# Patient Record
Sex: Female | Born: 1980 | Hispanic: Yes | Smoking: Never smoker
Health system: Southern US, Community
[De-identification: ages and names within clinical notes are randomized; demographics above are authoritative.]

## PROBLEM LIST (undated history)

## (undated) DIAGNOSIS — R7303 Prediabetes: Secondary | ICD-10-CM

## (undated) HISTORY — PX: ABDOMINAL HYSTERECTOMY: SHX81

## (undated) HISTORY — PX: CHOLECYSTECTOMY: SHX55

---

## 2009-12-18 ENCOUNTER — Other Ambulatory Visit: Admission: RE | Admit: 2009-12-18 | Discharge: 2009-12-18 | Payer: Self-pay | Admitting: Obstetrics and Gynecology

## 2011-06-25 ENCOUNTER — Other Ambulatory Visit (HOSPITAL_COMMUNITY)
Admission: RE | Admit: 2011-06-25 | Discharge: 2011-06-25 | Disposition: A | Discharge status: Home / Self Care | Payer: Self-pay | Source: Ambulatory Visit | Attending: Obstetrics & Gynecology | Admitting: Obstetrics & Gynecology

## 2011-06-25 DIAGNOSIS — Z01419 Encounter for gynecological examination (general) (routine) without abnormal findings: Secondary | ICD-10-CM | POA: Insufficient documentation

## 2012-07-20 ENCOUNTER — Other Ambulatory Visit: Payer: Self-pay | Admitting: Obstetrics & Gynecology

## 2012-07-24 ENCOUNTER — Encounter: Payer: Self-pay | Admitting: *Deleted

## 2012-07-25 ENCOUNTER — Ambulatory Visit (INDEPENDENT_AMBULATORY_CARE_PROVIDER_SITE_OTHER): Payer: BC Managed Care – PPO | Admitting: Obstetrics & Gynecology

## 2012-07-25 ENCOUNTER — Encounter: Payer: Self-pay | Admitting: Obstetrics & Gynecology

## 2012-07-25 VITALS — BP 100/60 | Ht 63.0 in | Wt 157.0 lb

## 2012-07-25 DIAGNOSIS — Z01419 Encounter for gynecological examination (general) (routine) without abnormal findings: Secondary | ICD-10-CM

## 2012-07-25 DIAGNOSIS — Z7251 High risk heterosexual behavior: Secondary | ICD-10-CM

## 2012-07-25 NOTE — Progress Notes (Signed)
Patient ID: Taleeyah Bora, female   DOB: [birth year], 32 y.o.   MRN: [id number] Subjective:     Domonique Cothran is a 32 y.o. female here for a routine exam.  Patient's last menstrual period was 07/13/2012. G1P1 Current complaints: wants std screen.  Personal health questionnaire reviewed: no.   Gynecologic History Patient's last menstrual period was 07/13/2012. Contraception: none Last Pap: 2013. Results were: normal Last mammogram: na. Results were: normal  Obstetric History OB History   Grav Para Term Preterm Abortions TAB SAB Ect Mult Living   1 1        1      # Outc Date GA Lbr Len/2nd Wgt Sex Del Anes PTL Lv   1 PAR 2005    F SVD   Yes       The following portions of the patient's history were reviewed and updated as appropriate: allergies, current medications, past family history, past medical history, past social history, past surgical history and problem list.  Review of Systems  Review of Systems  Constitutional: Negative for fever, chills, weight loss, malaise/fatigue and diaphoresis.  HENT: Negative for hearing loss, ear pain, nosebleeds, congestion, sore throat, neck pain, tinnitus and ear discharge.   Eyes: Negative for blurred vision, double vision, photophobia, pain, discharge and redness.  Respiratory: Negative for cough, hemoptysis, sputum production, shortness of breath, wheezing and stridor.   Cardiovascular: Negative for chest pain, palpitations, orthopnea, claudication, leg swelling and PND.  Gastrointestinal: negative for abdominal pain. Negative for heartburn, nausea, vomiting, diarrhea, constipation, blood in stool and melena.  Genitourinary: Negative for dysuria, urgency, frequency, hematuria and flank pain.  Musculoskeletal: Negative for myalgias, back pain, joint pain and falls.  Skin: Negative for itching and rash.  Neurological: Negative for dizziness, tingling, tremors, sensory change, speech change, focal weakness, seizures, loss of consciousness, weakness  and headaches.  Endo/Heme/Allergies: Negative for environmental allergies and polydipsia. Does not bruise/bleed easily.  Psychiatric/Behavioral: Negative for depression, suicidal ideas, hallucinations, memory loss and substance abuse. The patient is not nervous/anxious and does not have insomnia.        Objective:    Physical Exam  Vitals reviewed. Constitutional: She is oriented to person, place, and time. She appears well-developed and well-nourished.  HENT:  Head: Normocephalic and atraumatic.        Right Ear: External ear normal.  Left Ear: External ear normal.  Nose: Nose normal.  Mouth/Throat: Oropharynx is clear and moist.  Eyes: Conjunctivae and EOM are normal. Pupils are equal, round, and reactive to light. Right eye exhibits no discharge. Left eye exhibits no discharge. No scleral icterus.  Neck: Normal range of motion. Neck supple. No tracheal deviation present. No thyromegaly present.  Cardiovascular: Normal rate, regular rhythm, normal heart sounds and intact distal pulses.  Exam reveals no gallop and no friction rub.   No murmur heard. Respiratory: Effort normal and breath sounds normal. No respiratory distress. She has no wheezes. She has no rales. She exhibits no tenderness.  GI: Soft. Bowel sounds are normal. She exhibits no distension and no mass. There is no tenderness. There is no rebound and no guarding.  Genitourinary:       Vulva is normal without lesions Vagina is pink moist without discharge Cervix normal in appearance and pap is not done Uterus is normal size shape and contour Adnexa is negative with normal sized ovaries   Musculoskeletal: Normal range of motion. She exhibits no edema and no tenderness.  Neurological: She is alert and oriented to person,  place, and time. She has normal reflexes. She displays normal reflexes. No cranial nerve deficit. She exhibits normal muscle tone. Coordination normal.  Skin: Skin is warm and dry. No rash noted. No erythema.  No pallor.  Psychiatric: She has a normal mood and affect. Her behavior is normal. Judgment and thought content normal.       Assessment:    Healthy female exam.    Plan:    Follow up in: 1 year.

## 2012-07-26 LAB — HSV 2 ANTIBODY, IGG: HSV 2 Glycoprotein G Ab, IgG: <0.1 IV

## 2012-07-26 LAB — RPR

## 2012-08-21 ENCOUNTER — Telehealth: Payer: Self-pay | Admitting: Obstetrics and Gynecology

## 2012-08-22 ENCOUNTER — Telehealth: Payer: Self-pay | Admitting: *Deleted

## 2012-08-22 NOTE — Telephone Encounter (Signed)
Pt informed all labs and Pap on 07/25/2012 WNL.

## 2012-08-24 NOTE — Telephone Encounter (Signed)
Pt aware of results 

## 2013-12-10 ENCOUNTER — Encounter: Payer: Self-pay | Admitting: Obstetrics & Gynecology

## 2015-02-11 ENCOUNTER — Encounter: Payer: Self-pay | Admitting: Obstetrics & Gynecology

## 2015-02-11 ENCOUNTER — Other Ambulatory Visit (HOSPITAL_COMMUNITY)
Admission: RE | Admit: 2015-02-11 | Discharge: 2015-02-11 | Disposition: A | Discharge status: Home / Self Care | Payer: BC Managed Care – PPO | Source: Ambulatory Visit | Attending: Obstetrics & Gynecology | Admitting: Obstetrics & Gynecology

## 2015-02-11 ENCOUNTER — Ambulatory Visit (INDEPENDENT_AMBULATORY_CARE_PROVIDER_SITE_OTHER): Payer: BC Managed Care – PPO | Admitting: Obstetrics & Gynecology

## 2015-02-11 VITALS — BP 110/80 | HR 80 | Ht 65.0 in | Wt 172.0 lb

## 2015-02-11 DIAGNOSIS — Z1151 Encounter for screening for human papillomavirus (HPV): Secondary | ICD-10-CM | POA: Diagnosis not present

## 2015-02-11 DIAGNOSIS — Z01411 Encounter for gynecological examination (general) (routine) with abnormal findings: Secondary | ICD-10-CM | POA: Diagnosis present

## 2015-02-11 DIAGNOSIS — Z01419 Encounter for gynecological examination (general) (routine) without abnormal findings: Secondary | ICD-10-CM

## 2015-02-11 NOTE — Progress Notes (Signed)
Patient ID: Gina Odonnell, female   DOB: 1980-05-21, 35 y.o.   MRN: [id number] Subjective:     Gina Odonnell is a 35 y.o. female here for a routine exam.  Patient's last menstrual period was 02/08/2015. G1P1 Birth Control Method:  none Menstrual Calendar(currently): regular  Current complaints: none.   Current acute medical issues:  none   Recent Gynecologic History Patient's last menstrual period was 02/08/2015. Last Pap: 2014,  normal Last mammogram: na,    History reviewed. No pertinent past medical history.  Past Surgical History  Procedure Laterality Date  . Cholecystectomy      OB History    Gravida Para Term Preterm AB TAB SAB Ectopic Multiple Living   1 1        1       Social History   Social History  . Marital Status: Unknown    Spouse Name: N/A  . Number of Children: N/A  . Years of Education: N/A   Social History Main Topics  . Smoking status: Never Smoker   . Smokeless tobacco: Never Used  . Alcohol Use: No  . Drug Use: No  . Sexual Activity: Yes   Other Topics Concern  . None   Social History Narrative    Family History  Problem Relation Age of Onset  . Cancer Other     No current outpatient prescriptions on file.  Review of Systems  Review of Systems  Constitutional: Negative for fever, chills, weight loss, malaise/fatigue and diaphoresis.  HENT: Negative for hearing loss, ear pain, nosebleeds, congestion, sore throat, neck pain, tinnitus and ear discharge.   Eyes: Negative for blurred vision, double vision, photophobia, pain, discharge and redness.  Respiratory: Negative for cough, hemoptysis, sputum production, shortness of breath, wheezing and stridor.   Cardiovascular: Negative for chest pain, palpitations, orthopnea, claudication, leg swelling and PND.  Gastrointestinal: negative for abdominal pain. Negative for heartburn, nausea, vomiting, diarrhea, constipation, blood in stool and melena.  Genitourinary: Negative for dysuria,  urgency, frequency, hematuria and flank pain.  Musculoskeletal: Negative for myalgias, back pain, joint pain and falls.  Skin: Negative for itching and rash.  Neurological: Negative for dizziness, tingling, tremors, sensory change, speech change, focal weakness, seizures, loss of consciousness, weakness and headaches.  Endo/Heme/Allergies: Negative for environmental allergies and polydipsia. Does not bruise/bleed easily.  Psychiatric/Behavioral: Negative for depression, suicidal ideas, hallucinations, memory loss and substance abuse. The patient is not nervous/anxious and does not have insomnia.        Objective:  Blood pressure 110/80, pulse 80, height 5\' 5"  (1.651 m), weight 172 lb (78.019 kg), last menstrual period 02/08/2015.   Physical Exam  Vitals reviewed. Constitutional: She is oriented to person, place, and time. She appears well-developed and well-nourished.  HENT:  Head: Normocephalic and atraumatic.        Right Ear: External ear normal.  Left Ear: External ear normal.  Nose: Nose normal.  Mouth/Throat: Oropharynx is clear and moist.  Eyes: Conjunctivae and EOM are normal. Pupils are equal, round, and reactive to light. Right eye exhibits no discharge. Left eye exhibits no discharge. No scleral icterus.  Neck: Normal range of motion. Neck supple. No tracheal deviation present. No thyromegaly present.  Cardiovascular: Normal rate, regular rhythm, normal heart sounds and intact distal pulses.  Exam reveals no gallop and no friction rub.   No murmur heard. Respiratory: Effort normal and breath sounds normal. No respiratory distress. She has no wheezes. She has no rales. She exhibits no tenderness.  GI: Soft.  Bowel sounds are normal. She exhibits no distension and no mass. There is no tenderness. There is no rebound and no guarding.  Genitourinary:  Breasts no masses skin changes or nipple changes bilaterally      Vulva is normal without lesions Vagina is pink moist without  discharge Cervix normal in appearance and pap is done Uterus is normal size shape and contour Adnexa is negative with normal sized ovaries   Musculoskeletal: Normal range of motion. She exhibits no edema and no tenderness.  Neurological: She is alert and oriented to person, place, and time. She has normal reflexes. She displays normal reflexes. No cranial nerve deficit. She exhibits normal muscle tone. Coordination normal.  Skin: Skin is warm and dry. No rash noted. No erythema. No pallor.  Psychiatric: She has a normal mood and affect. Her behavior is normal. Judgment and thought content normal.       Assessment:    Healthy female exam.    Plan:    follow up as needed  or 1 year

## 2015-02-13 LAB — CYTOLOGY - PAP

## 2015-09-15 ENCOUNTER — Encounter (HOSPITAL_COMMUNITY): Payer: Self-pay | Admitting: Emergency Medicine

## 2015-09-15 ENCOUNTER — Emergency Department (HOSPITAL_COMMUNITY)
Admission: EM | Admit: 2015-09-15 | Discharge: 2015-09-15 | Disposition: A | Discharge status: Home / Self Care | Payer: BC Managed Care – PPO | Attending: Emergency Medicine | Admitting: Emergency Medicine

## 2015-09-15 ENCOUNTER — Emergency Department (HOSPITAL_COMMUNITY): Payer: BC Managed Care – PPO

## 2015-09-15 DIAGNOSIS — R1010 Upper abdominal pain, unspecified: Secondary | ICD-10-CM | POA: Diagnosis not present

## 2015-09-15 DIAGNOSIS — G43909 Migraine, unspecified, not intractable, without status migrainosus: Secondary | ICD-10-CM | POA: Diagnosis present

## 2015-09-15 DIAGNOSIS — R51 Headache: Secondary | ICD-10-CM | POA: Diagnosis not present

## 2015-09-15 DIAGNOSIS — R519 Headache, unspecified: Secondary | ICD-10-CM

## 2015-09-15 DIAGNOSIS — Z791 Long term (current) use of non-steroidal anti-inflammatories (NSAID): Secondary | ICD-10-CM | POA: Diagnosis not present

## 2015-09-15 DIAGNOSIS — R42 Dizziness and giddiness: Secondary | ICD-10-CM | POA: Insufficient documentation

## 2015-09-15 LAB — URINALYSIS, ROUTINE W REFLEX MICROSCOPIC
Bilirubin Urine: NEGATIVE
GLUCOSE, UA: NEGATIVE mg/dL
Hgb urine dipstick: NEGATIVE
KETONES UR: NEGATIVE mg/dL
LEUKOCYTES UA: NEGATIVE
Nitrite: NEGATIVE
PH: 5.5 (ref 5.0–8.0)
Protein, ur: NEGATIVE mg/dL
Specific Gravity, Urine: >1.03 — ABNORMAL HIGH (ref 1.005–1.030)

## 2015-09-15 LAB — COMPREHENSIVE METABOLIC PANEL
ALT: 34 U/L (ref 14–54)
ANION GAP: 6 (ref 5–15)
AST: 28 U/L (ref 15–41)
Albumin: 4 g/dL (ref 3.5–5.0)
Alkaline Phosphatase: 71 U/L (ref 38–126)
BILIRUBIN TOTAL: 0.5 mg/dL (ref 0.3–1.2)
BUN: 9 mg/dL (ref 6–20)
CHLORIDE: 101 mmol/L (ref 101–111)
CO2: 26 mmol/L (ref 22–32)
Calcium: 8.5 mg/dL — ABNORMAL LOW (ref 8.9–10.3)
Creatinine, Ser: 0.56 mg/dL (ref 0.44–1.00)
Glucose, Bld: 96 mg/dL (ref 65–99)
POTASSIUM: 4 mmol/L (ref 3.5–5.1)
Sodium: 133 mmol/L — ABNORMAL LOW (ref 135–145)
TOTAL PROTEIN: 7.5 g/dL (ref 6.5–8.1)

## 2015-09-15 LAB — CBC
HCT: 34.2 % — ABNORMAL LOW (ref 36.0–46.0)
HEMOGLOBIN: 11.8 g/dL — AB (ref 12.0–15.0)
MCH: 26.9 pg (ref 26.0–34.0)
MCHC: 34.5 g/dL (ref 30.0–36.0)
MCV: 78.1 fL (ref 78.0–100.0)
Platelets: 310 10*3/uL (ref 150–400)
RBC: 4.38 MIL/uL (ref 3.87–5.11)
RDW: 14.4 % (ref 11.5–15.5)
WBC: 4.3 10*3/uL (ref 4.0–10.5)

## 2015-09-15 LAB — PREGNANCY, URINE: Preg Test, Ur: NEGATIVE

## 2015-09-15 MED ORDER — ONDANSETRON HCL 4 MG PO TABS
4.0000 mg | ORAL_TABLET | Freq: Four times a day (QID) | ORAL | 0 refills | Status: DC
Start: 2015-09-15 — End: 2017-12-13

## 2015-09-15 MED ORDER — MECLIZINE HCL 12.5 MG PO TABS
25.0000 mg | ORAL_TABLET | Freq: Once | ORAL | Status: AC
  Administered 2015-09-15: 25 mg via ORAL
  Filled 2015-09-15: qty 2

## 2015-09-15 MED ORDER — MECLIZINE HCL 12.5 MG PO TABS: 12.5000 mg | ORAL_TABLET | Freq: Three times a day (TID) | ORAL | 0 refills | Status: DC | PRN

## 2015-09-15 MED ORDER — KETOROLAC TROMETHAMINE 30 MG/ML IJ SOLN
30.0000 mg | Freq: Once | INTRAMUSCULAR | Status: AC
  Administered 2015-09-15: 30 mg via INTRAVENOUS
  Filled 2015-09-15: qty 1

## 2015-09-15 MED ORDER — SODIUM CHLORIDE 0.9 % IV BOLUS (SEPSIS)
1000.0000 mL | Freq: Once | INTRAVENOUS | Status: AC
  Administered 2015-09-15: 1000 mL via INTRAVENOUS

## 2015-09-15 MED ORDER — ONDANSETRON HCL 4 MG/2ML IJ SOLN
4.0000 mg | Freq: Once | INTRAMUSCULAR | Status: AC
  Administered 2015-09-15: 4 mg via INTRAVENOUS
  Filled 2015-09-15: qty 2

## 2015-09-15 NOTE — ED Provider Notes (Signed)
[facility] DEPT Provider Note   CSN: [id number] Arrival date & time: 09/15/15  1110  First Provider Contact:  First MD Initiated Contact with Patient 09/15/15 1418        History   Chief Complaint Chief Complaint  Patient presents with  . Migraine    HPI Gina Odonnell is a 35 y.o. female.  Pt said that she's been dizzy with a headache for 2 days.  She also has upper abdominal pain.  She initially went to see her pcp who sent her here.  She had 1 episode of vomiting.  She mainly feels dizzy when she stands up, but also feels dizzy with head movement.  Pt has never had anything like this in the past.   The history is provided by the patient.  Migraine  This is a new problem. The current episode started more than 2 days ago. The problem has not changed since onset.Associated symptoms include abdominal pain and headaches. The symptoms are aggravated by standing. Nothing relieves the symptoms.    History reviewed. No pertinent past medical history.  There are no active problems to display for this patient.   Past Surgical History:  Procedure Laterality Date  . CHOLECYSTECTOMY      OB History    Gravida Para Term Preterm AB Living   1 1       1    SAB TAB Ectopic Multiple Live Births           1       Home Medications    Prior to Admission medications   Medication Sig Start Date End Date Taking? Authorizing Provider  ibuprofen (ADVIL,MOTRIN) 200 MG tablet Take 200 mg by mouth every 6 (six) hours as needed for moderate pain.   Yes Historical Provider, MD  meclizine (ANTIVERT) 12.5 MG tablet Take 1 tablet (12.5 mg total) by mouth 3 (three) times daily as needed for dizziness. 09/15/15   Isla Pence, MD  ondansetron (ZOFRAN) 4 MG tablet Take 1 tablet (4 mg total) by mouth every 6 (six) hours. 09/15/15   Isla Pence, MD    Family History Family History  Problem Relation Age of Onset  . Cancer Other     Social History Social History  Substance Use Topics  .  Smoking status: Never Smoker  . Smokeless tobacco: Never Used  . Alcohol use No     Allergies   Review of patient's allergies indicates no known allergies.   Review of Systems Review of Systems  Gastrointestinal: Positive for abdominal pain.  Neurological: Positive for dizziness and headaches.  All other systems reviewed and are negative.    Physical Exam Updated Vital Signs BP 103/61   Pulse (!) 55   Temp 98.1 F (36.7 C) (Oral)   Resp 16   Ht 5\' 5"  (1.651 m)   Wt 165 lb (74.8 kg)   LMP 09/10/2015   SpO2 100%   BMI 27.46 kg/m   Physical Exam  Constitutional: She is oriented to person, place, and time. She appears well-developed and well-nourished.  HENT:  Head: Normocephalic and atraumatic.  Right Ear: External ear normal.  Left Ear: External ear normal.  Nose: Nose normal.  Mouth/Throat: Oropharynx is clear and moist.  Eyes: Conjunctivae and EOM are normal. Pupils are equal, round, and reactive to light.  Neck: Normal range of motion. Neck supple.  Cardiovascular: Normal rate, regular rhythm, normal heart sounds and intact distal pulses.   Pulmonary/Chest: Effort normal and breath sounds normal.  Abdominal: Soft.  Bowel sounds are normal.  Musculoskeletal: Normal range of motion.  Neurological: She is alert and oriented to person, place, and time.  Pt feels dizzy with eye and head movement.  Skin: Skin is warm and dry.  Psychiatric: She has a normal mood and affect. Her behavior is normal. Judgment and thought content normal.  Nursing note and vitals reviewed.    ED Treatments / Results  Labs (all labs ordered are listed, but only abnormal results are displayed) Labs Reviewed  COMPREHENSIVE METABOLIC PANEL - Abnormal; Notable for the following:       Result Value   Sodium 133 (*)    Calcium 8.5 (*)    All other components within normal limits  CBC - Abnormal; Notable for the following:    Hemoglobin 11.8 (*)    HCT 34.2 (*)    All other components  within normal limits  URINALYSIS, ROUTINE W REFLEX MICROSCOPIC (NOT AT [facility]) - Abnormal; Notable for the following:    Specific Gravity, Urine >1.030 (*)    All other components within normal limits  PREGNANCY, URINE    EKG  EKG Interpretation None       Radiology Ct Head Wo Contrast  Result Date: 09/15/2015 CLINICAL DATA:  Headache, dizziness. EXAM: CT HEAD WITHOUT CONTRAST TECHNIQUE: Contiguous axial images were obtained from the base of the skull through the vertex without intravenous contrast. COMPARISON:  None. FINDINGS: Bony calvarium appears intact. No mass effect or midline shift is noted. Ventricular size is within normal limits. There is no evidence of mass lesion, hemorrhage or acute infarction. IMPRESSION: Normal head CT. Electronically Signed   By: Marijo Conception, M.D.   On: 09/15/2015 16:35    Procedures Procedures (including critical care time)  Medications Ordered in ED Medications  sodium chloride 0.9 % bolus 1,000 mL (1,000 mLs Intravenous New Bag/Given 09/15/15 1444)  ondansetron (ZOFRAN) injection 4 mg (4 mg Intravenous Given 09/15/15 1444)  ketorolac (TORADOL) 30 MG/ML injection 30 mg (30 mg Intravenous Given 09/15/15 1444)  meclizine (ANTIVERT) tablet 25 mg (25 mg Oral Given 09/15/15 1444)     Initial Impression / Assessment and Plan / ED Course  I have reviewed the triage vital signs and the nursing notes.  Pertinent labs & imaging results that were available during my care of the patient were reviewed by me and considered in my medical decision making (see chart for details).  Clinical Course   Pt is feeling much better.  She knows to return if worse.  Final Clinical Impressions(s) / ED Diagnoses   Final diagnoses:  Acute nonintractable headache, unspecified headache type  Vertigo    New Prescriptions New Prescriptions   MECLIZINE (ANTIVERT) 12.5 MG TABLET    Take 1 tablet (12.5 mg total) by mouth 3 (three) times daily as needed for dizziness.    ONDANSETRON (ZOFRAN) 4 MG TABLET    Take 1 tablet (4 mg total) by mouth every 6 (six) hours.     Isla Pence, MD 09/15/15 [phone]

## 2015-09-15 NOTE — ED Triage Notes (Addendum)
Pt reports headache,abd pain, fatigue,weakness,dizziness x2 days. Pt reports light and sound sensitivity. nad noted.

## 2016-06-01 ENCOUNTER — Other Ambulatory Visit: Payer: BC Managed Care – PPO | Admitting: Obstetrics & Gynecology

## 2017-07-12 ENCOUNTER — Other Ambulatory Visit: Payer: BC Managed Care – PPO | Admitting: Obstetrics & Gynecology

## 2017-08-08 ENCOUNTER — Other Ambulatory Visit: Payer: BC Managed Care – PPO | Admitting: Obstetrics & Gynecology

## 2017-12-05 ENCOUNTER — Other Ambulatory Visit (HOSPITAL_COMMUNITY)
Admission: RE | Admit: 2017-12-05 | Discharge: 2017-12-05 | Disposition: A | Discharge status: Home / Self Care | Payer: BC Managed Care – PPO | Source: Ambulatory Visit | Attending: Adult Health | Admitting: Adult Health

## 2017-12-05 ENCOUNTER — Encounter: Payer: Self-pay | Admitting: Adult Health

## 2017-12-05 ENCOUNTER — Ambulatory Visit: Payer: BC Managed Care – PPO | Admitting: Adult Health

## 2017-12-05 VITALS — BP 107/63 | HR 78 | Ht 64.0 in | Wt 167.0 lb

## 2017-12-05 DIAGNOSIS — Z01419 Encounter for gynecological examination (general) (routine) without abnormal findings: Secondary | ICD-10-CM | POA: Insufficient documentation

## 2017-12-05 DIAGNOSIS — N920 Excessive and frequent menstruation with regular cycle: Secondary | ICD-10-CM

## 2017-12-05 DIAGNOSIS — N852 Hypertrophy of uterus: Secondary | ICD-10-CM

## 2017-12-05 NOTE — Progress Notes (Signed)
Patient ID: Gina Odonnell, female   DOB: March 09, 1980, 37 y.o.   MRN: [id number] History of Present Illness: Gina Odonnell is a 37 year old Hispanic female, single, G1P1 in for well woman gyn exam and pap. No PCP.   Current Medications, Allergies, Past Medical History, Past Surgical History, Family History and Social History were reviewed in [employer] record.     Review of Systems: Patient denies any headaches, hearing loss, blurred vision, shortness of breath, chest pain, abdominal pain, problems with bowel movements, urination, or intercourse(not currently). No joint pain or mood swings. Periods have been heavier over the last year, no pain. Changes pads every 1-2 hours.  +tired.   Physical Exam:BP 107/63 (BP Location: Left Arm, Patient Position: Sitting, Cuff Size: Normal)   Pulse 78   Ht 5\' 4"  (1.626 m)   Wt 167 lb (75.8 kg)   LMP 11/25/2017   BMI 28.67 kg/m  General:  Well developed, well nourished, no acute distress Skin:  Warm and dry Neck:  Midline trachea, normal thyroid, good ROM, no lymphadenopathy Lungs; Clear to auscultation bilaterally Breast:  No dominant palpable mass, retraction, or nipple discharge Cardiovascular: Regular rate and rhythm Abdomen:  Soft, non tender, no hepatosplenomegaly Pelvic:  External genitalia is normal in appearance, no lesions.  The vagina is normal in appearance. Urethra has no lesions or masses. The cervix is smooth, and bulbous.Pap with GC.CHL and HPV performed.  Uterus is felt to be hard, and about 16-18 weeks in size..  No adnexal masses or tenderness noted.Bladder is non tender, no masses felt. Extremities/musculoskeletal:  No swelling or varicosities noted, no clubbing or cyanosis Psych:  No mood changes, alert and cooperative,seems happy PHQ 9 score 0. Examination chaperoned by janet young LPN.  Impression:  1. Encounter for gynecological examination with Papanicolaou smear of cervix   2. Enlarged uterus   3.  Menorrhagia with regular cycle      Plan: Check CBC,CMP and TSH  Return in 1 week for GYN [place] and then see me 2-3 days later Physical in 1 year  Pap in 3 if normal Mammogram at 38

## 2017-12-05 NOTE — Addendum Note (Signed)
Addended by: Linton Rump on: 12/05/2017 04:27 PM   Modules accepted: Orders

## 2017-12-05 NOTE — Patient Instructions (Signed)
Uterine Fibroids Uterine fibroids are tissue masses (tumors) that can develop in the womb (uterus). They are also called leiomyomas. This type of tumor is not cancerous (benign) and does not spread to other parts of the body outside of the pelvic area, which is between the hip bones. Occasionally, fibroids may develop in the fallopian tubes, in the cervix, or on the support structures (ligaments) that surround the uterus. You can have one or many fibroids. Fibroids can vary in size, weight, and where they grow in the uterus. Some can become quite large. Most fibroids do not require medical treatment. What are the causes? A fibroid can develop when a single uterine cell keeps growing (replicating). Most cells in the human body have a control mechanism that keeps them from replicating without control. What are the signs or symptoms? Symptoms may include:  Heavy bleeding during your period.  Bleeding or spotting between periods.  Pelvic pain and pressure.  Bladder problems, such as needing to urinate more often (urinary frequency) or urgently.  Inability to reproduce offspring (infertility).  Miscarriages.  How is this diagnosed? Uterine fibroids are diagnosed through a physical exam. Your health care provider may feel the lumpy tumors during a pelvic exam. Ultrasonography and an MRI may be done to determine the size, location, and number of fibroids. How is this treated? Treatment may include:  Watchful waiting. This involves getting the fibroid checked by your health care provider to see if it grows or shrinks. Follow your health care provider's recommendations for how often to have this checked.  Hormone medicines. These can be taken by mouth or given through an intrauterine device (IUD).  Surgery. ? Removing the fibroids (myomectomy) or the uterus (hysterectomy). ? Removing blood supply to the fibroids (uterine artery embolization).  If fibroids interfere with your fertility and you  want to become pregnant, your health care provider may recommend having the fibroids removed. Follow these instructions at home:  Keep all follow-up visits as directed by your health care provider. This is important.  Take over-the-counter and prescription medicines only as told by your health care provider. ? If you were prescribed a hormone treatment, take the hormone medicines exactly as directed.  Ask your health care provider about taking iron pills and increasing the amount of dark green, leafy vegetables in your diet. These actions can help to boost your blood iron levels, which may be affected by heavy menstrual bleeding.  Pay close attention to your period and tell your health care provider about any changes, such as: ? Increased blood flow that requires you to use more pads or tampons than usual per month. ? A change in the number of days that your period lasts per month. ? A change in symptoms that are associated with your period, such as abdominal cramping or back pain. Contact a health care provider if:  You have pelvic pain, back pain, or abdominal cramps that cannot be controlled with medicines.  You have an increase in bleeding between and during periods.  You soak tampons or pads in a half hour or less.  You feel lightheaded, extra tired, or weak. Get help right away if:  You faint.  You have a sudden increase in pelvic pain. This information is not intended to replace advice given to you by your health care provider. Make sure you discuss any questions you have with your health care provider. Document Released: 01/23/2000 Document Revised: 09/25/2015 Document Reviewed: 07/24/2013 Elsevier Interactive Patient Education  2018 [employer]  Menorrhagia La menorragia es una afeccin por la cual los perodos menstruales son muy abundantes o duran ms de lo normal. La mayora de los perodos de una mujer con menorragia pueden causar una prdida de sangre  abundante y clicos abdominales que le impidan realizar sus actividades habituales. Cules son las causas? Las causas ms frecuentes de esta afeccin incluyen las siguientes:  Formaciones no cancerosas en el tero (plipos o fibromas).  Un desequilibrio entre las hormonas estrgeno y Immunologist.  Uno de los ovarios no libera vulos durante uno o ms meses.  Un problema con la glndula tiroidea (hipotiroidismo).  Efectos secundarios por haberse colocado un dispositivo intrauterino (DIU).  Efectos secundarios por algunos medicamentos, como antiinflamatorios o anticoagulantes.  Un trastorno hemorrgico que impide la Transport planner.  En algunos casos, se desconoce la causa de este trastorno. Cules son los signos o los sntomas? Los sntomas de esta afeccin incluyen lo siguiente:  Tiene que cambiar el apsito o el tampn cada 1 o 2 horas debido a que est completamente empapado.  Necesita usar apsitos y tampones al mismo tiempo porque pierde [employer].  Debe levantarse para cambiarse el apsito o el tampn durante la noche.  Elimina cogulos ms grandes de 1 pulgada (2,5 cm).  El sangrado dura ms de 7 das.  Tiene sntomas de niveles bajos de hierro (anemia), como cansancio, fatiga o falta de aire.  Cmo se diagnostica? Esta afeccin se puede diagnosticar en funcin de lo siguiente:  Un examen fsico.  Sus sntomas y antecedentes menstruales.  Estudios, por ejemplo: ? Anlisis de sangre para verificar si est embarazada o tiene cambios hormonales, trastornos de la tiroides o hemorrgicos, anemia, u otros problemas. ? Prueba de Papanicolaou para verificar cambios malignos, infecciones o inflamacin. ? Biopsia de endometrio. Esta prueba implica retirar Truddie Coco de tejido de la pared del tero (endometrio) para examinarlo con microscopio. ? Ecografa plvica. Este estudio [place] ondas de sonido para tomar imgenes del tero, los ovarios y Geneticist, molecular. Las  imgenes pueden mostrar si tiene fibromas u otros crecimientos. ? Histeroscopia. Para este estudio se [place] un pequeo telescopio para ver dentro de su tero.  Cmo se trata? Es posible que no se requiera tratamiento para esta afeccin. En caso de ser necesario, el mejor tratamiento para usted depender de lo siguiente:  Si necesita Environmental health practitioner.  Si desea tener hijos en el futuro.  La causa y la gravedad del sangrado.  Su preferencia personal.  Los medicamentos son Software engineer. Puede recibir [employer] siguientes tratamientos:  Mtodos anticonceptivos hormonales. Estos tratamientos reducen el sangrado durante el perodo menstrual. Estos incluyen los siguientes: ? Anticonceptivos orales. ? Parche drmico. ? Anillo vaginal. ? Inyecciones que recibe cada 3 meses. ? DIU hormonal (dispositivo intrauterino). ? Implantes que se colocan debajo de la piel.  Medicamentos que espesan la sangre y hacen ms lento el sangrado.  Medicamentos que reducen la inflamacin, como el ibuprofeno.  Medicamentos que contienen una hormona artificial (sinttica) llamada progestina.  Medicamentos que [employer] ovarios dejen de funcionar durante un breve lapso.  Suplementos de hierro para tratar la anemia.  Si los medicamentos no [facility], puede ser [place]. Algunas opciones quirrgicas son las siguientes:  Dilatacin y curetaje (D y C). En este procedimiento, su mdico abre (dilata) el cuello uterino y luego raspa o succiona tejido del endometrio para disminuir el sangrado menstrual.  Histeroscopia quirrgica. En este procedimiento, se utiliza un pequeo tubo con Ardelia Mems  luz en el extremo (histeroscopio) para observar el tero y ayudar en la extirpacin de un plipo que puede ser la causa de perodos abundantes.  Ablacin del endometrio. Se usan varias tcnicas para destruir permanentemente todo el endometrio. Luego de la ablacin del endometrio, la  mayora de las mujeres tienen escaso flujo menstrual, o no lo tienen. Este procedimiento reduce la posibilidad de quedar embarazada.  Reseccin del endometrio. En este procedimiento, se [place] un asa de alambre electroquirrgica para extirpar el endometrio. Este procedimiento reduce la posibilidad de quedar embarazada.  Histerectoma. Es la remocin [place] del tero. Es un procedimiento permanente que interrumpe los perodos [employer]. No es posible quedar embarazada luego de una histerectoma.  Siga estas instrucciones en su casa: Medicamentos  [employer] recetados y de venta libre exactamente como se lo haya indicado su mdico. Esto incluye pldoras de suplemento de hierro.  No cambie ni reemplace los medicamentos sin consultarlo con su mdico.  No tome aspirina ni medicamentos que contengan aspirina desde 1 semana antes ni durante el perodo menstrual. La aspirina puede hacer que la hemorragia empeore. Instrucciones generales  Si necesita cambiar el apsito o el tampn ms de una vez cada 2horas, limite su actividad hasta que la hemorragia se detenga.  Las pldoras de suplemento de hierro pueden Recruitment consultant. A fin de prevenir o tratar el estreimiento mientras toma suplementos de hierro recetados, el mdico puede recomendarle lo siguiente: ? Electronics engineer suficiente lquido para mantener la orina clara o de color amarillo plido. ? Tomar medicamentos recetados o de [employer]. ? Consumir alimentos ricos en fibra, como frutas y verduras frescas, cereales integrales y frijoles. ? Limitar el consumo de alimentos con alto contenido de grasas y azcares procesados, como alimentos fritos o dulces.  Seguir una dieta balanceada, lo que incluye alimentos con alto contenido de hierro. Entre estos alimentos se incluyen vegetales de [place], carne, [place], [place] y panes y Psychologist, prison and probation services.  No trate de perder peso hasta que la hemorragia anormal se detenga y los niveles de  hierro en la sangre vuelvan a la normalidad. Si debe perder peso, hable con su mdico para hacerlo de manera segura.  Concurra a todas las visitas de control como se lo haya indicado el mdico. Esto es importante. Comunquese con un mdico si:  Empapa un tampn o un apsito cada 1 o 2 horas, y [employer] ocurre cada vez que tiene el perodo.  Necesita usar apsitos y tampones al mismo tiempo porque pierde [employer].  Tiene nuseas, vmitos, diarrea u otros problemas relacionados con los medicamentos que est tomando. Solicite ayuda de inmediato si:  Empapa ms de un apsito o tampn en 1 hora.  Elimina cogulos ms grandes que 1 pulgada (2,5 cm).  Le falta el aire.  Siente que el corazn late [place] rpido.  Se siente mareada o se desmaya.  Se siente muy dbil o cansada. Resumen  La menorragia es una afeccin por la cual los perodos menstruales son muy abundantes o duran ms de lo normal.  El tratamiento depender de la causa de la afeccin y puede incluir medicamentos o procedimientos.  Tome los [employer] recetados y de venta libre exactamente como se lo haya indicado su mdico. Esto incluye pldoras de suplemento de hierro.  Busque ayuda de inmediato si tiene sangrado abundante y empapa ms de un apsito o tampn en 1 hora, si despide cogulos grandes o si se siente mareada, se desmaya o le falta de aire. Esta informacin no tiene [employer]  muy abundantes o duran más de lo normal.  · El tratamiento dependerá de la causa de la afección y puede incluir medicamentos o procedimientos.  · Tome los medicamentos recetados y de venta libre exactamente como se lo haya indicado su médico. Esto incluye píldoras de suplemento de hierro.  · Busque ayuda de inmediato si tiene sangrado abundante y empapa más de un apósito o tampón en 1 hora, si despide coágulos grandes o si se siente mareada, se desmaya o le falta de aire.  Esta información no tiene como fin reemplazar el consejo del médico. Asegúrese de hacerle al médico cualquier pregunta que tenga.  Document Released: 11/04/2004 Document Revised: 05/14/2016 Document Reviewed: 05/14/2016  Elsevier Interactive Patient Education © 2018 Elsevier Inc.

## 2017-12-06 ENCOUNTER — Telehealth: Payer: Self-pay | Admitting: Adult Health

## 2017-12-06 LAB — CBC
Hematocrit: 28.7 % — ABNORMAL LOW (ref 34.0–46.6)
Hemoglobin: 8.6 g/dL — ABNORMAL LOW (ref 11.1–15.9)
MCH: 19.7 pg — AB (ref 26.6–33.0)
MCHC: 30 g/dL — AB (ref 31.5–35.7)
MCV: 66 fL — ABNORMAL LOW (ref 79–97)
Platelets: 414 10*3/uL (ref 150–450)
RBC: 4.37 x10E6/uL (ref 3.77–5.28)
RDW: 19.5 % — AB (ref 12.3–15.4)
WBC: 5.7 10*3/uL (ref 3.4–10.8)

## 2017-12-06 LAB — COMPREHENSIVE METABOLIC PANEL
ALK PHOS: 66 IU/L (ref 39–117)
ALT: 23 IU/L (ref 0–32)
AST: 25 IU/L (ref 0–40)
Albumin/Globulin Ratio: 1.4 (ref 1.2–2.2)
Albumin: 4.2 g/dL (ref 3.5–5.5)
BUN/Creatinine Ratio: 14 (ref 9–23)
BUN: 9 mg/dL (ref 6–20)
CHLORIDE: 104 mmol/L (ref 96–106)
CO2: 23 mmol/L (ref 20–29)
CREATININE: 0.66 mg/dL (ref 0.57–1.00)
Calcium: 9.4 mg/dL (ref 8.7–10.2)
GFR calc Af Amer: 129 mL/min/{1.73_m2} (ref >59)
GFR calc non Af Amer: 112 mL/min/{1.73_m2} (ref >59)
GLUCOSE: 81 mg/dL (ref 65–99)
Globulin, Total: 3.1 g/dL (ref 1.5–4.5)
Potassium: 4.3 mmol/L (ref 3.5–5.2)
Sodium: 140 mmol/L (ref 134–144)
Total Protein: 7.3 g/dL (ref 6.0–8.5)

## 2017-12-06 LAB — TSH: TSH: 1.29 u[IU]/mL (ref 0.450–4.500)

## 2017-12-06 NOTE — Telephone Encounter (Signed)
Left message that HBG 8.6, take 2 iron tabs daily, blood sugar, kidney function and liver function and thyroid normal. Keep [place] appt next week

## 2017-12-07 LAB — CYTOLOGY - PAP
Adequacy: ABSENT
Chlamydia: NEGATIVE
Diagnosis: NEGATIVE
HPV: NOT DETECTED
Neisseria Gonorrhea: NEGATIVE

## 2017-12-09 ENCOUNTER — Ambulatory Visit (INDEPENDENT_AMBULATORY_CARE_PROVIDER_SITE_OTHER): Payer: BC Managed Care – PPO

## 2017-12-09 DIAGNOSIS — N852 Hypertrophy of uterus: Secondary | ICD-10-CM | POA: Diagnosis not present

## 2017-12-09 DIAGNOSIS — N920 Excessive and frequent menstruation with regular cycle: Secondary | ICD-10-CM | POA: Diagnosis not present

## 2017-12-09 NOTE — Progress Notes (Signed)
PELVIC US TA/TV: enlarged heterogeneous uterus w/mult.fibroids, largest fibroids (#1) subserosal anterior fibroid 6.5 x 9.8 x 9.8 cm,(#2) anterior subserosal 8.8 x 7.7 x 6.1 cm,(#3) posterior subserosal 6.3 x 5.5 x 5.5 cm,EEC 19 mm,normal ovaries bilat,no free fluid,no pain during ultrasound,limited view w/TV, best imaged w/T/A ultrasound.

## 2017-12-13 ENCOUNTER — Encounter: Payer: Self-pay | Admitting: Adult Health

## 2017-12-13 ENCOUNTER — Ambulatory Visit: Payer: BC Managed Care – PPO | Admitting: Adult Health

## 2017-12-13 VITALS — BP 120/70 | HR 102 | Ht 64.0 in | Wt 170.0 lb

## 2017-12-13 DIAGNOSIS — N852 Hypertrophy of uterus: Secondary | ICD-10-CM | POA: Diagnosis not present

## 2017-12-13 DIAGNOSIS — D252 Subserosal leiomyoma of uterus: Secondary | ICD-10-CM

## 2017-12-13 DIAGNOSIS — N920 Excessive and frequent menstruation with regular cycle: Secondary | ICD-10-CM | POA: Diagnosis not present

## 2017-12-13 DIAGNOSIS — D500 Iron deficiency anemia secondary to blood loss (chronic): Secondary | ICD-10-CM

## 2017-12-13 MED ORDER — FERROUS SULFATE 325 (65 FE) MG PO TABS: 325.0000 mg | ORAL_TABLET | Freq: Two times a day (BID) | ORAL | Status: DC

## 2017-12-13 MED ORDER — IRON 325 (65 FE) MG PO TABS: 1.0000 | ORAL_TABLET | Freq: Two times a day (BID) | ORAL | 0 refills | Status: DC

## 2017-12-13 NOTE — Patient Instructions (Signed)
Histerectoma abdominal (Abdominal Hysterectomy) La histerectoma abdominal es una ciruga en la que se extirpa el tero. El tero es la parte del cuerpo donde se desarrolla el feto. La ciruga puede hacerse por muchos motivos. Estos incluyen cncer, tumores, dolor a largo plazo o hemorragia. Es posible que Mining engineer extirpen otras partes del aparato reproductor durante esta ciruga. Esto depender de las causas por las cuales usted necesita la [place]. ANTES DEL PROCEDIMIENTO  Hable con el mdico sobre los cambios corporales. Estos cambios pueden ser fsicos y emocionales.  Es posible que deba hacerse anlisis de [place]. Tambin puede ser necesario que le tomen radiografas.  Si fuma, deje de hacerlo. Pdale ayuda al mdico.  Deje de tomar medicamentos anticoagulantes segn las indicaciones del mdico.  El mdico puede recetarle otros medicamentos. Tome los [employer] como le indic el mdico.  No coma ni beba [employer] las 6 a 8 horas previas a la [place].  Tome sus medicamentos habituales con un sorbito de [place].  Tome un bao de inmersin o una ducha la noche o la maana anterior a la [place].  PROCEDIMIENTO  Esta ciruga se hace en el hospital.  Marin Comment administrarn un medicamento que la har dormir (anestesia general).  El mdico har un corte (incisin) a travs de la piel en la parte inferior del abdomen.  El corte puede tener de 5a 7pulgadas de [place]. Puede ser horizontal o vertical.  El mdico apartar el tejido que recubre al tero. El mdico extraer con cuidado el tero. Tambin puede extraer cualquier otra parte del aparato reproductor que sea necesario extirpar.  El mdico usar pinzas o puntos (suturas) para Publishing copy.  El mdico cerrar la herida con puntos o clips metlicos.  DESPUS DEL PROCEDIMIENTO  Sentir dolor inmediatamente despus del procedimiento.  Le administrarn medicamentos para calmar el dolor cuando est en el rea de  recuperacin.  La llevarn a la habitacin del hospital cuando el efecto de los medicamentos para dormir haya desaparecido.  Le darn instrucciones para el cuidado Financial planner.  Esta informacin no tiene Marine scientist el consejo del mdico. Asegrese de hacerle al mdico cualquier pregunta que tenga. Document Released: 01/30/2013 Document Revised: 01/30/2013 Document Reviewed: 11/17/2012 Elsevier Interactive Patient Education  2017 Elsevier Inc. Uterine Fibroids Uterine fibroids are tissue masses (tumors) that can develop in the womb (uterus). They are also called leiomyomas. This type of tumor is not cancerous (benign) and does not spread to other parts of the body outside of the pelvic area, which is between the hip bones. Occasionally, fibroids may develop in the fallopian tubes, in the cervix, or on the support structures (ligaments) that surround the uterus. You can have one or many fibroids. Fibroids can vary in size, weight, and where they grow in the uterus. Some can become quite large. Most fibroids do not require medical treatment. What are the causes? A fibroid can develop when a single uterine cell keeps growing (replicating). Most cells in the human body have a control mechanism that keeps them from replicating without control. What are the signs or symptoms? Symptoms may include:  Heavy bleeding during your period.  Bleeding or spotting between periods.  Pelvic pain and pressure.  Bladder problems, such as needing to urinate more often (urinary frequency) or urgently.  Inability to reproduce offspring (infertility).  Miscarriages.  How is this diagnosed? Uterine fibroids are diagnosed through a physical exam. Your health care provider may feel the lumpy tumors during a pelvic exam. Ultrasonography and an  MRI may be done to determine the size, location, and number of fibroids. How is this treated? Treatment may include:  Watchful waiting. This involves getting  the fibroid checked by your health care provider to see if it grows or shrinks. Follow your health care provider's recommendations for how often to have this checked.  Hormone medicines. These can be taken by mouth or given through an intrauterine device (IUD).  Surgery. ? Removing the fibroids (myomectomy) or the uterus (hysterectomy). ? Removing blood supply to the fibroids (uterine artery embolization).  If fibroids interfere with your fertility and you want to become pregnant, your health care provider may recommend having the fibroids removed. Follow these instructions at home:  Keep all follow-up visits as directed by your health care provider. This is important.  Take over-the-counter and prescription medicines only as told by your health care provider. ? If you were prescribed a hormone treatment, take the hormone medicines exactly as directed.  Ask your health care provider about taking iron pills and increasing the amount of dark green, leafy vegetables in your diet. These actions can help to boost your blood iron levels, which may be affected by heavy menstrual bleeding.  Pay close attention to your period and tell your health care provider about any changes, such as: ? Increased blood flow that requires you to use more pads or tampons than usual per month. ? A change in the number of days that your period lasts per month. ? A change in symptoms that are associated with your period, such as abdominal cramping or back pain. Contact a health care provider if:  You have pelvic pain, back pain, or abdominal cramps that cannot be controlled with medicines.  You have an increase in bleeding between and during periods.  You soak tampons or pads in a half hour or less.  You feel lightheaded, extra tired, or weak. Get help right away if:  You faint.  You have a sudden increase in pelvic pain. This information is not intended to replace advice given to you by your health care  provider. Make sure you discuss any questions you have with your health care provider. Document Released: 01/23/2000 Document Revised: 09/25/2015 Document Reviewed: 07/24/2013 Elsevier Interactive Patient Education  [employer].

## 2017-12-13 NOTE — Progress Notes (Signed)
  Subjective:     Patient ID: Gina Odonnell, female   DOB: 01-May-1980, 37 y.o.   MRN: [id number]  HPI Gina Odonnell is a 37 year old Hispanic female in with her mom to discuss [place].   Review of Systems +heavy bleeding +tired  Reviewed past medical,surgical, social and family history. Reviewed medications and allergies.     Objective:   Physical Exam BP 120/70 (BP Location: Left Arm, Patient Position: Sitting, Cuff Size: Normal)   Pulse (!) 102   Ht 5\' 4"  (1.626 m)   Wt 170 lb (77.1 kg)   LMP 11/25/2017   BMI 29.18 kg/m   Talk only: Reviewed labs again, HGB 8.6 and US shows enlarged uterus, volume 2111 ml and multiple fibroids, EEC 19 mm and ovaries look normal. Discussed hysterectomy as option, will come back in 1 week to talk with Dr Elonda Husky.    Assessment:     1. Fibroids, subserous   2. Enlarged uterus   3. Menorrhagia with regular cycle   4. Iron deficiency anemia due to chronic blood loss       Plan:     Take iron bid, she is already taking  Review handouts on hysterectomy and fibroids Return in about 1 week for pre op with Dr Elonda Husky

## 2017-12-21 ENCOUNTER — Ambulatory Visit: Payer: BC Managed Care – PPO | Admitting: Obstetrics & Gynecology

## 2017-12-21 ENCOUNTER — Encounter: Payer: Self-pay | Admitting: Obstetrics & Gynecology

## 2017-12-21 ENCOUNTER — Other Ambulatory Visit: Payer: Self-pay

## 2017-12-21 VITALS — BP 129/81 | HR 78 | Ht 65.0 in | Wt 167.0 lb

## 2017-12-21 DIAGNOSIS — D500 Iron deficiency anemia secondary to blood loss (chronic): Secondary | ICD-10-CM

## 2017-12-21 DIAGNOSIS — N920 Excessive and frequent menstruation with regular cycle: Secondary | ICD-10-CM | POA: Diagnosis not present

## 2017-12-21 DIAGNOSIS — D252 Subserosal leiomyoma of uterus: Secondary | ICD-10-CM

## 2017-12-21 DIAGNOSIS — N852 Hypertrophy of uterus: Secondary | ICD-10-CM

## 2017-12-21 MED ORDER — MEGESTROL ACETATE 40 MG PO TABS: 40.0000 mg | ORAL_TABLET | Freq: Every day | ORAL | 2 refills | Status: DC

## 2017-12-21 NOTE — Progress Notes (Signed)
Preoperative History and Physical  Gina Odonnell is a 37 y.o. G1P1 with Patient's last menstrual period was 12/16/2017. admitted for a abdominal hysterectomy .  She has very heavy menstrual periods which has resulted in significant iron deficiency anemia and her uterus is 24 weeks size on exam due to multiple fibroids and sonogram reveals total uteerine volume >2000 cc  PMH:   History reviewed. No pertinent past medical history.  PSH:     Past Surgical History:  Procedure Laterality Date  . CHOLECYSTECTOMY      POb/GynH:      OB History    Gravida  1   Para  1   Term      Preterm      AB      Living  1     SAB      TAB      Ectopic      Multiple      Live Births  1           SH:   Social History   Tobacco Use  . Smoking status: Never Smoker  . Smokeless tobacco: Never Used  Substance Use Topics  . Alcohol use: No  . Drug use: No    FH:    Family History  Problem Relation Age of Onset  . Cancer Other   . Colon cancer Father   . Hypertension Mother   . Diabetes Mother      Allergies: No Known Allergies  Medications:       Current Outpatient Medications:  .  Ferrous Sulfate (IRON) 325 (65 Fe) MG TABS, Take 1 tablet (325 mg total) by mouth 2 (two) times daily., Disp: 30 each, Rfl: 0 .  ibuprofen (ADVIL,MOTRIN) 200 MG tablet, Take 200 mg by mouth every 6 (six) hours as needed for moderate pain., Disp: , Rfl:   Review of Systems:   Review of Systems  Constitutional: Negative for fever, chills, weight loss, malaise/fatigue and diaphoresis.  HENT: Negative for hearing loss, ear pain, nosebleeds, congestion, sore throat, neck pain, tinnitus and ear discharge.   Eyes: Negative for blurred vision, double vision, photophobia, pain, discharge and redness.  Respiratory: Negative for cough, hemoptysis, sputum production, shortness of breath, wheezing and stridor.   Cardiovascular: Negative for chest pain, palpitations, orthopnea, claudication, leg  swelling and PND.  Gastrointestinal: Positive for abdominal pain. Negative for heartburn, nausea, vomiting, diarrhea, constipation, blood in stool and melena.  Genitourinary: Negative for dysuria, urgency, frequency, hematuria and flank pain.  Musculoskeletal: Negative for myalgias, back pain, joint pain and falls.  Skin: Negative for itching and rash.  Neurological: Negative for dizziness, tingling, tremors, sensory change, speech change, focal weakness, seizures, loss of consciousness, weakness and headaches.  Endo/Heme/Allergies: Negative for environmental allergies and polydipsia. Does not bruise/bleed easily.  Psychiatric/Behavioral: Negative for depression, suicidal ideas, hallucinations, memory loss and substance abuse. The patient is not nervous/anxious and does not have insomnia.      PHYSICAL EXAM:  Blood pressure 129/81, pulse 78, height 5\' 5"  (1.651 m), weight 167 lb (75.8 kg), last menstrual period 12/16/2017.    Vitals reviewed. Constitutional: She is oriented to person, place, and time. She appears well-developed and well-nourished.  HENT:  Head: Normocephalic and atraumatic.  Right Ear: External ear normal.  Left Ear: External ear normal.  Nose: Nose normal.  Mouth/Throat: Oropharynx is clear and moist.  Eyes: Conjunctivae and EOM are normal. Pupils are equal, round, and reactive to light. Right eye exhibits no discharge. Left  eye exhibits no discharge. No scleral icterus.  Neck: Normal range of motion. Neck supple. No tracheal deviation present. No thyromegaly present.  Cardiovascular: Normal rate, regular rhythm, normal heart sounds and intact distal pulses.  Exam reveals no gallop and no friction rub.   No murmur heard. Respiratory: Effort normal and breath sounds normal. No respiratory distress. She has no wheezes. She has no rales. She exhibits no tenderness.  GI: Soft. Bowel sounds are normal. She exhibits no distension and no mass. There is tenderness. There is no  rebound and no guarding.  Genitourinary:       Vulva is normal without lesions Vagina is pink moist without discharge Cervix normal in appearance and pap is normal Uterus is 24 weeks size Adnexa is negative with normal sized ovaries by sonogram  Musculoskeletal: Normal range of motion. She exhibits no edema and no tenderness.  Neurological: She is alert and oriented to person, place, and time. She has normal reflexes. She displays normal reflexes. No cranial nerve deficit. She exhibits normal muscle tone. Coordination normal.  Skin: Skin is warm and dry. No rash noted. No erythema. No pallor.  Psychiatric: She has a normal mood and affect. Her behavior is normal. Judgment and thought content normal.    Labs: No results found for this or any previous visit (from the past 336 hour(s)).  EKG: No orders found for this or any previous visit.  Imaging Studies: US Pelvis Transvanginal Non-ob (tv Only)  Result Date: 12/13/2017 GYNECOLOGIC SONOGRAM Gina Odonnell is a 37 y.o. G1P1 LMP 11/25/2017,she is here for a pelvic sonogram for enlarged uterus w/menorrhagia. Uterus                      16.2 x 14 x 17.8 cm, vol 2111 ml, enlarged heterogeneous uterus w/mult.fibroids Endometrium          19 mm, symmetrical, wnl Right ovary             3.2 x 2.3 x 2 cm, wnl Left ovary                4.1 x 1.4 x 2.6 cm, wnl Fibroids:                 (#1) subserosal anterior fibroid 6.5 x 9.8 x 9.8 cm,(#2) anterior subserosal 8.8 x 7.7 x 6.1 cm,(#3) posterior right subserosal 6.3 x 5.5 x 5.5 cm Technician Comments: PELVIC US TA/TV: enlarged heterogeneous uterus w/mult.fibroids, largest fibroids: (#1) subserosal anterior fibroid 6.5 x 9.8 x 9.8 cm,(#2) anterior subserosal 8.8 x 7.7 x 6.1 cm,(#3) posterior right subserosal 6.3 x 5.5 x 5.5 cm,EEC 19 mm,normal ovaries bilat,no free fluid,no pain during ultrasound,limited view w/TV, best imaged w/T/A ultrasound. Gina Odonnell 12/09/2017 10:15 AM Clinical Impression and  recommendations: I have reviewed the sonogram results above, combined with the patient's current clinical course, below are my impressions and any appropriate recommendations for management based on the sonographic findings. Uterus is dramatically enlarged due to fibroids Endometrium is distorted but essentially normal for a menstruating woman Ovaries are normal Florian Buff 12/13/2017 2:43 PM   US Pelvis (transabdominal Only)  Result Date: 12/13/2017 GYNECOLOGIC SONOGRAM Ivelise Castillo is a 37 y.o. G1P1 LMP 11/25/2017,she is here for a pelvic sonogram for enlarged uterus w/menorrhagia. Uterus                      16.2 x 14 x 17.8 cm, vol 2111 ml, enlarged heterogeneous uterus w/mult.fibroids Endometrium  19 mm, symmetrical, wnl Right ovary             3.2 x 2.3 x 2 cm, wnl Left ovary                4.1 x 1.4 x 2.6 cm, wnl Fibroids:                 (#1) subserosal anterior fibroid 6.5 x 9.8 x 9.8 cm,(#2) anterior subserosal 8.8 x 7.7 x 6.1 cm,(#3) posterior right subserosal 6.3 x 5.5 x 5.5 cm Technician Comments: PELVIC US TA/TV: enlarged heterogeneous uterus w/mult.fibroids, largest fibroids: (#1) subserosal anterior fibroid 6.5 x 9.8 x 9.8 cm,(#2) anterior subserosal 8.8 x 7.7 x 6.1 cm,(#3) posterior right subserosal 6.3 x 5.5 x 5.5 cm,EEC 19 mm,normal ovaries bilat,no free fluid,no pain during ultrasound,limited view w/TV, best imaged w/T/A ultrasound. Gina Odonnell 12/09/2017 10:15 AM Clinical Impression and recommendations: I have reviewed the sonogram results above, combined with the patient's current clinical course, below are my impressions and any appropriate recommendations for management based on the sonographic findings. Uterus is dramatically enlarged due to fibroids Endometrium is distorted but essentially normal for a menstruating woman Ovaries are normal Florian Buff 12/13/2017 2:43 PM      Assessment: 24 week size fibroid uterus, sonogram volume 2111 cc Anemia, due to  menorrhagia  Patient Active Problem List   Diagnosis Date Noted  . Encounter for gynecological examination with Papanicolaou smear of cervix 12/05/2017  . Enlarged uterus 12/05/2017  . Menorrhagia with regular cycle 12/05/2017    Plan: Abdominal supracervical hysterectomy with removal of both tubes  Pt and mom deciding on the timing Pt understands a blood transfusion pre op or intra op or post op is veery likely  Pt understands the risks of surgery including but not limited t  excessive bleeding requiring transfusion or reoperation, post-operative infection requiring prolonged hospitalization or re-hospitalization and antibiotic therapy, and damage to other organs including bladder, bowel, ureters and major vessels.  The patient also understands the alternative treatment options which were discussed in full.  All questions were answered.    Florian Buff 12/21/2017 3:24 PM      Face to face time:  15 minutes  Greater than 50% of the visit time was spent in counseling and coordination of care with the patient.  The summary and outline of the counseling and care coordination is summarized in the note above.   All questions were answered.

## 2017-12-29 ENCOUNTER — Encounter: Payer: Self-pay | Admitting: Obstetrics & Gynecology

## 2017-12-30 NOTE — Patient Instructions (Signed)
Gina Odonnell  12/30/2017     @PREFPERIOPPHARMACY @   Your procedure is scheduled on  01/11/2018.  Report to [facility] at  1000   A.M.  Call this number if you have problems the morning of surgery:  [phone]   Remember:  Do not eat or drink after midnight.                        Take these medicines the morning of surgery with A SIP OF WATER  None    Do not wear jewelry, make-up or nail polish.  Do not wear lotions, powders, or perfumes, or deodorant.  Do not shave 48 hours prior to surgery.  Men may shave face and neck.  Do not bring valuables to the hospital.  [facility] is not responsible for any belongings or valuables.  Contacts, dentures or bridgework may not be worn into surgery.  Leave your suitcase in the car.  After surgery it may be brought to your room.  For patients admitted to the hospital, discharge time will be determined by your treatment team.  Patients discharged the day of surgery will not be allowed to drive home.   Name and phone number of your driver:   family Special instructions:  None  Please read over the following fact sheets that you were given. Anesthesia Post-op Instructions and Care and Recovery After Surgery      Supracervical Hysterectomy A supracervical hysterectomy is surgery to remove the top part of the uterus, but not the cervix. You will no longer have menstrual periods or be able to get pregnant after this surgery. The fallopian tubes and ovaries may also be removed (bilateral salpingo-oophorectomy) during this surgery. This surgery is usually performed using a minimally invasive technique called laparoscopy. This technique allows the surgery to be done through small incisions. The minimally invasive technique provides benefits such as less pain, less risk of infection, and shorter recovery time. Tell a health care provider about:  Any allergies you have.  All medicines you are taking, including  vitamins, herbs, eye drops, creams, and over-the-counter medicines.  Any problems you or family members have had with anesthetic medicines.  Any blood disorders you have.  Any surgeries you have had.  Any medical conditions you have. What are the risks? Generally, this is a safe procedure. However, as with any procedure, complications can occur. Possible complications include:  Bleeding.  Blood clots in the legs or lung.  Infection.  Injury to surrounding organs.  Problems related to anesthesia.  Conversion to an open abdominal surgery.  Additional surgery later to remove the cervix if you have problems with the cervix.  What happens before the procedure?  Ask your health care provider about changing or stopping your regular medicines.  Do not take aspirin or blood thinners (anticoagulants) for 1 week before the surgery, or as directed by your health care provider.  Do not eat or drink anything for 8 hours before the surgery, or as directed by your health care provider.  Quit smoking if you smoke.  Arrange for a ride home after surgery and for someone to help you at home during recovery. What happens during the procedure?  You will be given an antibiotic medicine.  An IV tube will be placed in one of your veins. You will be given medicine to make you sleep (general anesthetic).  A gas (carbon dioxide)  will be used to inflate your abdomen. This will allow your surgeon to look inside your abdomen, perform your surgery, and treat any other problems found if necessary.  Three or four small incisions will be made in your abdomen. One of these incisions will be made in the area of your belly button (navel). A thin, flexible tube with a tiny camera and light on the end of it (laparoscope) will be inserted into the incision. The camera on the laparoscope sends a picture to a TV screen in the operating room. This gives your surgeon a good view inside the abdomen.  Other surgical  instruments will be inserted through the other incisions.  The uterus will be cut into small pieces and removed through the small incisions.  Your incisions will be closed. What happens after the procedure?  You will be taken to a recovery area where your progress will be monitored until you are awake, stable, and taking fluids well. If there are no other problems, you will then be moved to a regular hospital room, or you will be allowed to go home.  You will likely have minimal discomfort after the surgery because the incisions are so small with the laparoscopic technique.  You will be given pain medicine while you are in the hospital and for when you go home.  If a bilateral salpingo-oophorectomy was performed before menopause, you will go through a sudden (abrupt) menopause. This can be helped with hormone medicines. This information is not intended to replace advice given to you by your health care provider. Make sure you discuss any questions you have with your health care provider. Document Released: 07/14/2007 Document Revised: 07/03/2015 Document Reviewed: 07/28/2012 Elsevier Interactive Patient Education  2018 [place] Hysterectomy, Care After Refer to this sheet in the next few weeks. These instructions provide you with information on caring for yourself after your procedure. Your health care provider may also give you more specific instructions. Your treatment has been planned according to current medical practices, but problems sometimes occur. Call your health care provider if you have any problems or questions after your procedure. What can I expect after the procedure? After your procedure, it is typical to have some discomfort, tenderness, swelling, and bruising at the surgical sites. This normally lasts for about 2 weeks. Follow these instructions at home:  Get plenty of rest and sleep.  Only take over-the-counter or prescription medicines as directed by  your health care provider.  Do not take aspirin. It can cause bleeding.  Do not drive until your health care provider approves.  Follow your health care provider's advice regarding exercise, lifting, and general activities.  Resume your usual diet as directed by your health care provider.  Do not douche, use tampons, or have sexual intercourse for at least 6 weeks or until your health care provider gives you permission.  Change your bandages (dressings) only as directed by your health care provider.  Monitor your temperature.  Take showers instead of baths for 2-3 weeks or as directed by your health care provider.  Drink enough fluids to keep your urine clear or pale yellow.  Do not drink alcohol until your health care provider gives you permission.  If you are constipated, you may take a mild laxative if your health care provider approves. Bran foods may also help with constipation problems.  Try to have someone home with you for 1-2 weeks to help with activities.  Follow up with your health care provider as directed.  Contact a health care provider if:  You have swelling, redness, or increasing pain in the incision area.  You have pus coming from an incision.  You notice a bad smell coming from the incision or dressing.  You have swelling, redness, or pain in the area around the IV site.  Your incision breaks open.  You feel dizzy or lightheaded.  You have pain or bleeding when you urinate.  You have persistent diarrhea.  You have persistent nausea and vomiting.  You have abnormal vaginal discharge.  You have a rash.  Your pain is not controlled with your prescribed medicine. Get help right away if:  You have a fever.  You have severe abdominal pain.  You have chest pain.  You have shortness of breath.  You faint.  You have pain, swelling, or redness in your leg.  You have heavy vaginal bleeding with blood clots. This information is not intended to  replace advice given to you by your health care provider. Make sure you discuss any questions you have with your health care provider. Document Released: 11/15/2012 Document Revised: 07/03/2015 Document Reviewed: 07/28/2012 Elsevier Interactive Patient Education  2018 [place], also called tubectomy, is the surgical removal of one of the fallopian tubes. The fallopian tubes are where eggs travel from the ovaries to the uterus. Removing one fallopian tube does not prevent you from becoming pregnant. It also does not cause problems with your menstrual periods. You may need a salpingectomy if you:  Have a fertilized egg that attaches to the fallopian tube (ectopic pregnancy), especially one that causes the tube to burst or tear (rupture).  Have an infected fallopian tube.  Have cancer of the fallopian tube or nearby organs.  Have had an ovary removed due to a cyst or tumor.  Have had your uterus removed.  There are three different methods that can be used for a salpingectomy:  Open. This method involves making one large incision in your abdomen.  Laparoscopic. This method involves using a thin, lighted tube with a tiny camera on the end (laparoscope) to help perform the procedure. The laparoscope will allow your surgeon to make several small incisions in the abdomen instead of a large incision.  Robot-assisted: This method involves using a computer to control surgical instruments that are attached to robotic arms.  Tell a health care provider about:  Any allergies you have.  All medicines you are taking, including vitamins, herbs, eye drops, creams, and over-the-counter medicines.  Any problems you or family members have had with anesthetic medicines.  Any blood disorders you have.  Any surgeries you have had.  Any medical conditions you have.  Whether you are pregnant or may be pregnant. What are the risks? Generally, this is a safe procedure.  However, problems may occur, including:  Infection.  Bleeding.  Allergic reactions to medicines.  Damage to other structures or organs.  Blood clots in the legs or lungs.  What happens before the procedure? Staying hydrated Follow instructions from your health care provider about hydration, which may include:  Up to 2 hours before the procedure - you may continue to drink clear liquids, such as water, clear fruit juice, black coffee, and plain tea.  Eating and drinking restrictions Follow instructions from your health care provider about eating and drinking, which may include:  8 hours before the procedure - stop eating heavy meals or foods such as meat, fried foods, or fatty foods.  6 hours before the procedure -  stop eating light meals or foods, such as toast or cereal.  6 hours before the procedure - stop drinking milk or drinks that contain milk.  2 hours before the procedure - stop drinking clear liquids.  Medicines  Ask your health care provider about: ? Changing or stopping your regular medicines. This is especially important if you are taking diabetes medicines or blood thinners. ? Taking medicines such as aspirin and ibuprofen. These medicines can thin your blood. Do not take these medicines before your procedure if your health care provider instructs you not to.  You may be given antibiotic medicine to help prevent infection. General instructions  Do not smoke for at least 2 weeks before your procedure. If you need help quitting, ask your health care provider.  You may have an exam or tests, such as an electrocardiogram (ECG).  You may have a blood or urine sample taken.  Ask your health care provider: ? Whether you should stop removing hair from your surgical area. ? How your surgical site will be marked or identified.  You may be asked to shower with a germ-killing soap.  Plan to have someone take you home from the hospital or clinic.  If you will be  going home right after the procedure, plan to have someone with you for 24 hours. What happens during the procedure?  To reduce your risk of infection: ? Your health care team will wash or sanitize their hands. ? Hair may be removed from the surgical area. ? Your skin will be washed with soap.  An IV tube will be inserted into one of your veins.  You will be given a medicine to make you fall asleep (general anesthetic). You may also be given a medicine to help you relax (sedative).  A thin tube (catheter) may be inserted through your urethra and into your bladder to drain urine during your procedure.  Depending on the type of procedure you are having, one incision or several small incisions will be made in your abdomen.  Your fallopian tube will be cut and removed from where it attaches to your uterus.  Your blood vessels will be clamped and tied to prevent excess bleeding.  The incision(s) in your abdomen will be closed with stitches (sutures), staples, or skin glue.  A bandage (dressing) may be placed over your incision(s). The procedure may vary among health care providers and hospitals. What happens after the procedure?  Your blood pressure, heart rate, breathing rate, and blood oxygen level will be monitored until the medicines you were given have worn off.  You may continue to receive fluids and medicines through an IV tube.  You may continue to have a catheter draining your urine.  You may have to wear compression stockings. These stockings help to prevent blood clots and reduce swelling in your legs.  You will be given pain medicine as needed.  Do not drive for 24 hours if you received a sedative. Summary  Salpingectomy is a surgical procedure to remove one of the fallopian tubes.  The procedure may be done with an open incision, with a laparoscope, or with computer-controlled instruments.  Depending on the type of procedure you are having, one incision or several  small incisions will be made in your abdomen.  Your blood pressure, heart rate, breathing rate, and blood oxygen level will be monitored until the medicines you were given have worn off.  Plan to have someone take you home from the hospital or clinic. This  information is not intended to replace advice given to you by your health care provider. Make sure you discuss any questions you have with your health care provider. Document Released: 06/13/2008 Document Revised: 09/12/2015 Document Reviewed: 07/19/2012 Elsevier Interactive Patient Education  2018 [place] Anesthesia, Adult General anesthesia is the use of medicines to make a person "go to sleep" (be unconscious) for a medical procedure. General anesthesia is often recommended when a procedure:  Is long.  Requires you to be still or in an unusual position.  Is major and can cause you to lose blood.  Is impossible to do without general anesthesia.  The medicines used for general anesthesia are called general anesthetics. In addition to making you sleep, the medicines:  Prevent pain.  Control your blood pressure.  Relax your muscles.  Tell a health care provider about:  Any allergies you have.  All medicines you are taking, including vitamins, herbs, eye drops, creams, and over-the-counter medicines.  Any problems you or family members have had with anesthetic medicines.  Types of anesthetics you have had in the past.  Any bleeding disorders you have.  Any surgeries you have had.  Any medical conditions you have.  Any history of heart or lung conditions, such as heart failure, sleep apnea, or chronic obstructive pulmonary disease (COPD).  Whether you are pregnant or may be pregnant.  Whether you use tobacco, alcohol, marijuana, or street drugs.  Any history of Armed forces logistics/support/administrative officer.  Any history of depression or anxiety. What are the risks? Generally, this is a safe procedure. However, problems may  occur, including:  Allergic reaction to anesthetics.  Lung and heart problems.  Inhaling food or liquids from your stomach into your lungs (aspiration).  Injury to nerves.  Waking up during your procedure and being unable to move (rare).  Extreme agitation or a state of mental confusion (delirium) when you wake up from the anesthetic.  Air in the bloodstream, which can lead to stroke.  These problems are more likely to develop if you are having a major surgery or if you have an advanced medical condition. You can prevent some of these complications by answering all of your health care provider's questions thoroughly and by following all pre-procedure instructions. General anesthesia can cause side effects, including:  Nausea or vomiting  A sore throat from the breathing tube.  Feeling cold or shivery.  Feeling tired, washed out, or achy.  Sleepiness or drowsiness.  Confusion or agitation.  What happens before the procedure? Staying hydrated Follow instructions from your health care provider about hydration, which may include:  Up to 2 hours before the procedure - you may continue to drink clear liquids, such as water, clear fruit juice, black coffee, and plain tea.  Eating and drinking restrictions Follow instructions from your health care provider about eating and drinking, which may include:  8 hours before the procedure - stop eating heavy meals or foods such as meat, fried foods, or fatty foods.  6 hours before the procedure - stop eating light meals or foods, such as toast or cereal.  6 hours before the procedure - stop drinking milk or drinks that contain milk.  2 hours before the procedure - stop drinking clear liquids.  Medicines  Ask your health care provider about: ? Changing or stopping your regular medicines. This is especially important if you are taking diabetes medicines or blood thinners. ? Taking medicines such as aspirin and ibuprofen. These  medicines can thin your blood.  Do not take these medicines before your procedure if your health care provider instructs you not to. ? Taking new dietary supplements or medicines. Do not take these during the week before your procedure unless your health care provider approves them.  If you are told to take a medicine or to continue taking a medicine on the day of the procedure, take the medicine with sips of water. General instructions   Ask if you will be going home the same day, the following day, or after a longer hospital stay. ? Plan to have someone take you home. ? Plan to have someone stay with you for the first 24 hours after you leave the hospital or clinic.  For 3-6 weeks before the procedure, try not to use any tobacco products, such as cigarettes, chewing tobacco, and e-cigarettes.  You may brush your teeth on the morning of the procedure, but make sure to spit out the toothpaste. What happens during the procedure?  You will be given anesthetics through a mask and through an IV tube in one of your veins.  You may receive medicine to help you relax (sedative).  As soon as you are asleep, a breathing tube may be used to help you breathe.  An anesthesia specialist will stay with you throughout the procedure. He or she will help keep you comfortable and safe by continuing to give you medicines and adjusting the amount of medicine that you get. He or she will also watch your blood pressure, pulse, and oxygen levels to make sure that the anesthetics do not cause any problems.  If a breathing tube was used to help you breathe, it will be removed before you wake up. The procedure may vary among health care providers and hospitals. What happens after the procedure?  You will wake up, often slowly, after the procedure is complete, usually in a recovery area.  Your blood pressure, heart rate, breathing rate, and blood oxygen level will be monitored until the medicines you were given  have worn off.  You may be given medicine to help you calm down if you feel anxious or agitated.  If you will be going home the same day, your health care provider may check to make sure you can stand, drink, and urinate.  Your health care providers will treat your pain and side effects before you go home.  Do not drive for 24 hours if you received a sedative.  You may: ? Feel nauseous and vomit. ? Have a sore throat. ? Have mental slowness. ? Feel cold or shivery. ? Feel sleepy. ? Feel tired. ? Feel sore or achy, even in parts of your body where you did not have surgery. This information is not intended to replace advice given to you by your health care provider. Make sure you discuss any questions you have with your health care provider. Document Released: 05/04/2007 Document Revised: 07/08/2015 Document Reviewed: 01/09/2015 Elsevier Interactive Patient Education  2018 [place] Anesthesia, Adult, Care After These instructions provide you with information about caring for yourself after your procedure. Your health care provider may also give you more specific instructions. Your treatment has been planned according to current medical practices, but problems sometimes occur. Call your health care provider if you have any problems or questions after your procedure. What can I expect after the procedure? After the procedure, it is common to have:  Vomiting.  A sore throat.  Mental slowness.  It is common to feel:  Nauseous.  Cold or shivery.  Sleepy.  Tired.  Sore or achy, even in parts of your body where you did not have surgery.  Follow these instructions at home: For at least 24 hours after the procedure:  Do not: ? Participate in activities where you could fall or become injured. ? Drive. ? Use heavy machinery. ? Drink alcohol. ? Take sleeping pills or medicines that cause drowsiness. ? Make important decisions or sign legal documents. ? Take care  of children on your own.  Rest. Eating and drinking  If you vomit, drink water, juice, or soup when you can drink without vomiting.  Drink enough fluid to keep your urine clear or pale yellow.  Make sure you have little or no nausea before eating solid foods.  Follow the diet recommended by your health care provider. General instructions  Have a responsible adult stay with you until you are awake and alert.  Return to your normal activities as told by your health care provider. Ask your health care provider what activities are safe for you.  Take over-the-counter and prescription medicines only as told by your health care provider.  If you smoke, do not smoke without supervision.  Keep all follow-up visits as told by your health care provider. This is important. Contact a health care provider if:  You continue to have nausea or vomiting at home, and medicines are not helpful.  You cannot drink fluids or start eating again.  You cannot urinate after 8-12 hours.  You develop a skin rash.  You have fever.  You have increasing redness at the site of your procedure. Get help right away if:  You have difficulty breathing.  You have chest pain.  You have unexpected bleeding.  You feel that you are having a life-threatening or urgent problem. This information is not intended to replace advice given to you by your health care provider. Make sure you discuss any questions you have with your health care provider. Document Released: 05/03/2000 Document Revised: 06/30/2015 Document Reviewed: 01/09/2015 Elsevier Interactive Patient Education  [employer].

## 2018-01-04 ENCOUNTER — Other Ambulatory Visit: Payer: Self-pay | Admitting: Obstetrics & Gynecology

## 2018-01-10 ENCOUNTER — Other Ambulatory Visit: Payer: Self-pay

## 2018-01-10 ENCOUNTER — Encounter (HOSPITAL_COMMUNITY): Payer: Self-pay

## 2018-01-10 ENCOUNTER — Encounter (HOSPITAL_COMMUNITY)
Admission: RE | Admit: 2018-01-10 | Discharge: 2018-01-10 | Disposition: A | Discharge status: Home / Self Care | Payer: BC Managed Care – PPO | Source: Ambulatory Visit | Attending: Obstetrics & Gynecology | Admitting: Obstetrics & Gynecology

## 2018-01-10 DIAGNOSIS — Z01812 Encounter for preprocedural laboratory examination: Secondary | ICD-10-CM | POA: Insufficient documentation

## 2018-01-10 HISTORY — DX: Prediabetes: R73.03

## 2018-01-10 LAB — URINALYSIS, ROUTINE W REFLEX MICROSCOPIC
Bilirubin Urine: NEGATIVE
Glucose, UA: NEGATIVE mg/dL
Ketones, ur: NEGATIVE mg/dL
Leukocytes, UA: NEGATIVE
Nitrite: NEGATIVE
Protein, ur: NEGATIVE mg/dL
Specific Gravity, Urine: 1.026 (ref 1.005–1.030)
pH: 5 (ref 5.0–8.0)

## 2018-01-10 LAB — COMPREHENSIVE METABOLIC PANEL
ALT: 24 U/L (ref 0–44)
ANION GAP: 5 (ref 5–15)
AST: 22 U/L (ref 15–41)
Albumin: 3.8 g/dL (ref 3.5–5.0)
Alkaline Phosphatase: 60 U/L (ref 38–126)
BUN: 8 mg/dL (ref 6–20)
CO2: 24 mmol/L (ref 22–32)
Calcium: 8.6 mg/dL — ABNORMAL LOW (ref 8.9–10.3)
Chloride: 109 mmol/L (ref 98–111)
Creatinine, Ser: 0.58 mg/dL (ref 0.44–1.00)
GFR calc non Af Amer: >60 mL/min (ref >60)
Glucose, Bld: 89 mg/dL (ref 70–99)
Potassium: 3.8 mmol/L (ref 3.5–5.1)
Sodium: 138 mmol/L (ref 135–145)
Total Bilirubin: 0.2 mg/dL — ABNORMAL LOW (ref 0.3–1.2)
Total Protein: 7.5 g/dL (ref 6.5–8.1)

## 2018-01-10 LAB — CBC
HCT: 33.2 % — ABNORMAL LOW (ref 36.0–46.0)
Hemoglobin: 10.4 g/dL — ABNORMAL LOW (ref 12.0–15.0)
MCH: 22.2 pg — ABNORMAL LOW (ref 26.0–34.0)
MCHC: 31.3 g/dL (ref 30.0–36.0)
MCV: 70.9 fL — ABNORMAL LOW (ref 80.0–100.0)
Platelets: 405 10*3/uL — ABNORMAL HIGH (ref 150–400)
RBC: 4.68 MIL/uL (ref 3.87–5.11)
RDW: 26.3 % — ABNORMAL HIGH (ref 11.5–15.5)
WBC: 5.4 10*3/uL (ref 4.0–10.5)
nRBC: 0 % (ref 0.0–0.2)

## 2018-01-10 LAB — RAPID HIV SCREEN (HIV 1/2 AB+AG)
HIV 1/2 ANTIBODIES: NONREACTIVE
HIV-1 P24 Antigen - HIV24: NONREACTIVE

## 2018-01-10 LAB — ABO/RH: ABO/RH(D): A NEG

## 2018-01-10 LAB — PREPARE RBC (CROSSMATCH)

## 2018-01-10 LAB — HCG, QUANTITATIVE, PREGNANCY: hCG, Beta Chain, Quant, S: <1 m[IU]/mL (ref <5)

## 2018-01-10 NOTE — Progress Notes (Signed)
Please excuse Gina Odonnell from work today 01/10/2018.   She had a preop appointment at [facility].

## 2018-01-11 ENCOUNTER — Inpatient Hospital Stay (HOSPITAL_COMMUNITY)
Admission: RE | Admit: 2018-01-11 | Discharge: 2018-01-12 | DRG: 743 | Disposition: A | Discharge status: Home / Self Care | Payer: BC Managed Care – PPO | Source: Ambulatory Visit | Attending: Obstetrics & Gynecology | Admitting: Obstetrics & Gynecology

## 2018-01-11 ENCOUNTER — Encounter: Payer: BC Managed Care – PPO | Admitting: Obstetrics & Gynecology

## 2018-01-11 ENCOUNTER — Inpatient Hospital Stay (HOSPITAL_COMMUNITY): Payer: BC Managed Care – PPO | Admitting: Anesthesiology

## 2018-01-11 ENCOUNTER — Other Ambulatory Visit: Payer: Self-pay

## 2018-01-11 ENCOUNTER — Encounter (HOSPITAL_COMMUNITY)
Admission: RE | Disposition: A | Discharge status: Home / Self Care | Payer: Self-pay | Source: Ambulatory Visit | Attending: Obstetrics & Gynecology

## 2018-01-11 ENCOUNTER — Encounter (HOSPITAL_COMMUNITY): Payer: Self-pay | Admitting: Anesthesiology

## 2018-01-11 DIAGNOSIS — D259 Leiomyoma of uterus, unspecified: Principal | ICD-10-CM | POA: Diagnosis present

## 2018-01-11 DIAGNOSIS — N852 Hypertrophy of uterus: Secondary | ICD-10-CM | POA: Diagnosis present

## 2018-01-11 DIAGNOSIS — N946 Dysmenorrhea, unspecified: Secondary | ICD-10-CM | POA: Diagnosis present

## 2018-01-11 DIAGNOSIS — Z90710 Acquired absence of both cervix and uterus: Secondary | ICD-10-CM | POA: Diagnosis present

## 2018-01-11 DIAGNOSIS — N920 Excessive and frequent menstruation with regular cycle: Secondary | ICD-10-CM | POA: Diagnosis present

## 2018-01-11 DIAGNOSIS — D509 Iron deficiency anemia, unspecified: Secondary | ICD-10-CM | POA: Diagnosis present

## 2018-01-11 HISTORY — PX: SUPRACERVICAL ABDOMINAL HYSTERECTOMY: SHX5393

## 2018-01-11 HISTORY — PX: BILATERAL SALPINGECTOMY: SHX5743

## 2018-01-11 LAB — POCT HEMOGLOBIN-HEMACUE: Hemoglobin: 7.9 g/dL — ABNORMAL LOW (ref 12.0–15.0)

## 2018-01-11 SURGERY — HYSTERECTOMY, SUPRACERVICAL, ABDOMINAL
Anesthesia: General | Site: Abdomen

## 2018-01-11 MED ORDER — HYDROCODONE-ACETAMINOPHEN 7.5-325 MG PO TABS: 1.0000 | ORAL_TABLET | Freq: Once | ORAL | Status: DC | PRN

## 2018-01-11 MED ORDER — ONDANSETRON HCL 4 MG/2ML IJ SOLN
INTRAMUSCULAR | Status: AC
  Filled 2018-01-11: qty 2

## 2018-01-11 MED ORDER — MEPERIDINE HCL 50 MG/ML IJ SOLN: 6.2500 mg | INTRAMUSCULAR | Status: DC | PRN

## 2018-01-11 MED ORDER — MIDAZOLAM HCL 5 MG/5ML IJ SOLN
INTRAMUSCULAR | Status: DC | PRN
  Administered 2018-01-11: 2 mg via INTRAVENOUS

## 2018-01-11 MED ORDER — BUPIVACAINE LIPOSOME 1.3 % IJ SUSP
INTRAMUSCULAR | Status: AC
  Filled 2018-01-11: qty 20

## 2018-01-11 MED ORDER — DEXAMETHASONE SODIUM PHOSPHATE 4 MG/ML IJ SOLN
INTRAMUSCULAR | Status: DC | PRN
  Administered 2018-01-11: 8 mg via INTRAVENOUS

## 2018-01-11 MED ORDER — OXYCODONE-ACETAMINOPHEN 5-325 MG PO TABS
1.0000 | ORAL_TABLET | ORAL | Status: DC | PRN
  Administered 2018-01-11: 1 via ORAL
  Filled 2018-01-11: qty 1

## 2018-01-11 MED ORDER — SUGAMMADEX SODIUM 200 MG/2ML IV SOLN
INTRAVENOUS | Status: DC | PRN
  Administered 2018-01-11: 144.2 mg via INTRAVENOUS

## 2018-01-11 MED ORDER — BISACODYL 10 MG RE SUPP: 10.0000 mg | Freq: Every day | RECTAL | Status: DC | PRN

## 2018-01-11 MED ORDER — PROMETHAZINE HCL 25 MG/ML IJ SOLN
25.0000 mg | Freq: Four times a day (QID) | INTRAMUSCULAR | Status: DC | PRN
  Administered 2018-01-11: 25 mg via INTRAVENOUS
  Filled 2018-01-11: qty 1

## 2018-01-11 MED ORDER — CEFAZOLIN SODIUM-DEXTROSE 2-4 GM/100ML-% IV SOLN
2.0000 g | INTRAVENOUS | Status: AC
  Administered 2018-01-11: 2 g via INTRAVENOUS

## 2018-01-11 MED ORDER — MENTHOL 3 MG MT LOZG: 1.0000 | LOZENGE | OROMUCOSAL | Status: DC | PRN

## 2018-01-11 MED ORDER — FENTANYL CITRATE (PF) 250 MCG/5ML IJ SOLN
INTRAMUSCULAR | Status: AC
  Filled 2018-01-11: qty 5

## 2018-01-11 MED ORDER — DOCUSATE SODIUM 100 MG PO CAPS
100.0000 mg | ORAL_CAPSULE | Freq: Two times a day (BID) | ORAL | Status: DC
  Administered 2018-01-11 – 2018-01-12 (×2): 100 mg via ORAL
  Filled 2018-01-11 (×2): qty 1

## 2018-01-11 MED ORDER — LIDOCAINE 2% (20 MG/ML) 5 ML SYRINGE
INTRAMUSCULAR | Status: AC
  Filled 2018-01-11: qty 5

## 2018-01-11 MED ORDER — LACTATED RINGERS IV SOLN: INTRAVENOUS | Status: DC

## 2018-01-11 MED ORDER — SENNOSIDES-DOCUSATE SODIUM 8.6-50 MG PO TABS: 1.0000 | ORAL_TABLET | Freq: Every evening | ORAL | Status: DC | PRN

## 2018-01-11 MED ORDER — DEXAMETHASONE SODIUM PHOSPHATE 10 MG/ML IJ SOLN
INTRAMUSCULAR | Status: AC
  Filled 2018-01-11: qty 1

## 2018-01-11 MED ORDER — MIDAZOLAM HCL 2 MG/2ML IJ SOLN
INTRAMUSCULAR | Status: AC
  Filled 2018-01-11: qty 2

## 2018-01-11 MED ORDER — FENTANYL CITRATE (PF) 100 MCG/2ML IJ SOLN
50.0000 ug | INTRAMUSCULAR | Status: DC | PRN
  Administered 2018-01-11 – 2018-01-12 (×3): 100 ug via INTRAVENOUS
  Filled 2018-01-11 (×3): qty 2

## 2018-01-11 MED ORDER — 0.9 % SODIUM CHLORIDE (POUR BTL) OPTIME
TOPICAL | Status: DC | PRN
  Administered 2018-01-11 (×2): 1000 mL

## 2018-01-11 MED ORDER — LACTATED RINGERS IV SOLN
INTRAVENOUS | Status: DC
  Administered 2018-01-11 (×4): via INTRAVENOUS

## 2018-01-11 MED ORDER — LEVOFLOXACIN IN D5W 750 MG/150ML IV SOLN
750.0000 mg | Freq: Once | INTRAVENOUS | Status: AC
  Administered 2018-01-11: 750 mg via INTRAVENOUS
  Filled 2018-01-11: qty 150

## 2018-01-11 MED ORDER — ZOLPIDEM TARTRATE 5 MG PO TABS: 10.0000 mg | ORAL_TABLET | Freq: Every evening | ORAL | Status: DC | PRN

## 2018-01-11 MED ORDER — BUPIVACAINE LIPOSOME 1.3 % IJ SUSP
INTRAMUSCULAR | Status: DC | PRN
  Administered 2018-01-11: 20 mL

## 2018-01-11 MED ORDER — HEMOSTATIC AGENTS (NO CHARGE) OPTIME
TOPICAL | Status: DC | PRN
  Administered 2018-01-11: 1

## 2018-01-11 MED ORDER — KETOROLAC TROMETHAMINE 30 MG/ML IJ SOLN
30.0000 mg | Freq: Once | INTRAMUSCULAR | Status: AC
  Administered 2018-01-11: 30 mg via INTRAVENOUS

## 2018-01-11 MED ORDER — KCL IN DEXTROSE-NACL 20-5-0.45 MEQ/L-%-% IV SOLN
INTRAVENOUS | Status: AC
  Administered 2018-01-11: 15:00:00 via INTRAVENOUS

## 2018-01-11 MED ORDER — ROCURONIUM 10MG/ML (10ML) SYRINGE FOR MEDFUSION PUMP - OPTIME
INTRAVENOUS | Status: DC | PRN
  Administered 2018-01-11 (×2): 10 mg via INTRAVENOUS
  Administered 2018-01-11: 50 mg via INTRAVENOUS
  Administered 2018-01-11: 10 mg via INTRAVENOUS

## 2018-01-11 MED ORDER — DIPHENHYDRAMINE HCL 50 MG/ML IJ SOLN: 25.0000 mg | Freq: Four times a day (QID) | INTRAMUSCULAR | Status: DC | PRN

## 2018-01-11 MED ORDER — ZOLPIDEM TARTRATE 5 MG PO TABS: 5.0000 mg | ORAL_TABLET | Freq: Every evening | ORAL | Status: DC | PRN

## 2018-01-11 MED ORDER — SODIUM CHLORIDE 0.9 % IV SOLN
510.0000 mg | Freq: Once | INTRAVENOUS | Status: AC
  Administered 2018-01-11: 510 mg via INTRAVENOUS
  Filled 2018-01-11: qty 17

## 2018-01-11 MED ORDER — ONDANSETRON HCL 4 MG PO TABS
8.0000 mg | ORAL_TABLET | Freq: Four times a day (QID) | ORAL | Status: DC | PRN
  Administered 2018-01-11: 8 mg via ORAL
  Filled 2018-01-11: qty 2

## 2018-01-11 MED ORDER — SUGAMMADEX SODIUM 200 MG/2ML IV SOLN
INTRAVENOUS | Status: AC
  Filled 2018-01-11: qty 2

## 2018-01-11 MED ORDER — ROCURONIUM BROMIDE 10 MG/ML (PF) SYRINGE
PREFILLED_SYRINGE | INTRAVENOUS | Status: AC
  Filled 2018-01-11: qty 10

## 2018-01-11 MED ORDER — ONDANSETRON HCL 4 MG/2ML IJ SOLN
INTRAMUSCULAR | Status: DC | PRN
  Administered 2018-01-11: 4 mg via INTRAVENOUS

## 2018-01-11 MED ORDER — PROPOFOL 10 MG/ML IV BOLUS
INTRAVENOUS | Status: DC | PRN
  Administered 2018-01-11: 150 mg via INTRAVENOUS

## 2018-01-11 MED ORDER — GABAPENTIN 100 MG PO CAPS
100.0000 mg | ORAL_CAPSULE | Freq: Three times a day (TID) | ORAL | Status: DC
  Administered 2018-01-11 – 2018-01-12 (×3): 100 mg via ORAL
  Filled 2018-01-11 (×3): qty 1

## 2018-01-11 MED ORDER — FENTANYL CITRATE (PF) 100 MCG/2ML IJ SOLN
INTRAMUSCULAR | Status: DC | PRN
  Administered 2018-01-11 (×10): 50 ug via INTRAVENOUS

## 2018-01-11 MED ORDER — SIMETHICONE 80 MG PO CHEW: 80.0000 mg | CHEWABLE_TABLET | Freq: Four times a day (QID) | ORAL | Status: DC | PRN

## 2018-01-11 MED ORDER — HYDROMORPHONE HCL 1 MG/ML IJ SOLN
0.2500 mg | INTRAMUSCULAR | Status: DC | PRN
  Administered 2018-01-11 (×4): 0.5 mg via INTRAVENOUS
  Filled 2018-01-11 (×4): qty 0.5

## 2018-01-11 MED ORDER — SODIUM CHLORIDE 0.9 % IV SOLN
8.0000 mg | Freq: Four times a day (QID) | INTRAVENOUS | Status: DC | PRN
  Filled 2018-01-11: qty 4

## 2018-01-11 MED ORDER — KETOROLAC TROMETHAMINE 30 MG/ML IJ SOLN
INTRAMUSCULAR | Status: AC
  Filled 2018-01-11: qty 1

## 2018-01-11 MED ORDER — PROMETHAZINE HCL 25 MG/ML IJ SOLN: 6.2500 mg | INTRAMUSCULAR | Status: DC | PRN

## 2018-01-11 MED ORDER — KETOROLAC TROMETHAMINE 30 MG/ML IJ SOLN
30.0000 mg | Freq: Four times a day (QID) | INTRAMUSCULAR | Status: AC
  Administered 2018-01-11 – 2018-01-12 (×3): 30 mg via INTRAVENOUS
  Filled 2018-01-11 (×3): qty 1

## 2018-01-11 MED ORDER — CEFAZOLIN SODIUM-DEXTROSE 2-4 GM/100ML-% IV SOLN
INTRAVENOUS | Status: AC
  Filled 2018-01-11: qty 100

## 2018-01-11 MED ORDER — BUPIVACAINE LIPOSOME 1.3 % IJ SUSP
20.0000 mL | Freq: Once | INTRAMUSCULAR | Status: DC
  Filled 2018-01-11: qty 20

## 2018-01-11 SURGICAL SUPPLY — 48 items
BLADE SURG SZ10 CARB STEEL (BLADE) ×3 IMPLANT
CLOTH BEACON ORANGE TIMEOUT ST (SAFETY) ×3 IMPLANT
COVER LIGHT HANDLE STERIS (MISCELLANEOUS) ×6 IMPLANT
DERMABOND ADVANCED (GAUZE/BANDAGES/DRESSINGS) ×1
DERMABOND ADVANCED .7 DNX12 (GAUZE/BANDAGES/DRESSINGS) ×2 IMPLANT
DRAPE WARM FLUID 44X44 (DRAPE) ×3 IMPLANT
DRSG OPSITE POSTOP 4X10 (GAUZE/BANDAGES/DRESSINGS) ×3 IMPLANT
DRSG OPSITE POSTOP 4X8 (GAUZE/BANDAGES/DRESSINGS) ×3 IMPLANT
ELECT REM PT RETURN 9FT ADLT (ELECTROSURGICAL) ×3
ELECTRODE REM PT RTRN 9FT ADLT (ELECTROSURGICAL) ×2 IMPLANT
GAUZE 4X4 16PLY RFD (DISPOSABLE) ×6 IMPLANT
GAUZE SPONGE 4X4 12PLY STRL (GAUZE/BANDAGES/DRESSINGS) ×3 IMPLANT
GAUZE SPONGE 4X4 16PLY XRAY LF (GAUZE/BANDAGES/DRESSINGS) ×3 IMPLANT
GLOVE BIO SURGEON STRL SZ7 (GLOVE) ×3 IMPLANT
GLOVE BIOGEL PI IND STRL 7.0 (GLOVE) ×6 IMPLANT
GLOVE BIOGEL PI IND STRL 8 (GLOVE) ×2 IMPLANT
GLOVE BIOGEL PI INDICATOR 7.0 (GLOVE) ×3
GLOVE BIOGEL PI INDICATOR 8 (GLOVE) ×1
GLOVE ECLIPSE 6.5 STRL STRAW (GLOVE) ×3 IMPLANT
GLOVE ECLIPSE 8.0 STRL XLNG CF (GLOVE) ×3 IMPLANT
GOWN STRL REUS W/ TWL LRG LVL3 (GOWN DISPOSABLE) ×4 IMPLANT
GOWN STRL REUS W/TWL LRG LVL3 (GOWN DISPOSABLE) ×2
GOWN STRL REUS W/TWL XL LVL3 (GOWN DISPOSABLE) ×3 IMPLANT
INST SET MAJOR GENERAL (KITS) ×3 IMPLANT
KIT TURNOVER KIT A (KITS) ×3 IMPLANT
MANIFOLD NEPTUNE II (INSTRUMENTS) ×3 IMPLANT
NEEDLE HYPO 18GX1.5 BLUNT FILL (NEEDLE) ×3 IMPLANT
NEEDLE HYPO 21X1.5 SAFETY (NEEDLE) ×3 IMPLANT
NS IRRIG 1000ML POUR BTL (IV SOLUTION) ×6 IMPLANT
PACK ABDOMINAL MAJOR (CUSTOM PROCEDURE TRAY) ×3 IMPLANT
PAD ABD 5X9 TENDERSORB (GAUZE/BANDAGES/DRESSINGS) ×6 IMPLANT
PAD ARMBOARD 7.5X6 YLW CONV (MISCELLANEOUS) ×3 IMPLANT
SET BASIN LINEN APH (SET/KITS/TRAYS/PACK) ×3 IMPLANT
SPONGE LAP 18X18 RF (DISPOSABLE) ×9 IMPLANT
SUT CHROMIC 0 CT 1 (SUTURE) ×3 IMPLANT
SUT MON AB 3-0 SH 27 (SUTURE) ×21 IMPLANT
SUT VIC AB 0 CT1 27 (SUTURE) ×4
SUT VIC AB 0 CT1 27XBRD ANTBC (SUTURE) ×2 IMPLANT
SUT VIC AB 0 CT1 27XCR 8 STRN (SUTURE) ×6 IMPLANT
SUT VIC AB 0 CTX 36 (SUTURE) ×1
SUT VIC AB 0 CTX36XBRD ANTBCTR (SUTURE) ×2 IMPLANT
SUT VICRYL 3 0 (SUTURE) ×3 IMPLANT
SYR 20CC LL (SYRINGE) ×6 IMPLANT
TAPE CLOTH SILK CARING 3INX10 (GAUZE/BANDAGES/DRESSINGS) ×3 IMPLANT
TAPE CLOTH SURG 4X10 WHT LF (GAUZE/BANDAGES/DRESSINGS) ×3 IMPLANT
TOWEL SURG RFD BLUE STRL DISP (DISPOSABLE) ×3 IMPLANT
TRAY FOLEY W/BAG SLVR 16FR (SET/KITS/TRAYS/PACK) ×1
TRAY FOLEY W/BAG SLVR 16FR ST (SET/KITS/TRAYS/PACK) ×2 IMPLANT

## 2018-01-11 NOTE — Anesthesia Procedure Notes (Signed)
Procedure Name: Intubation Date/Time: 01/11/2018 11:14 AM Performed by: Ollen Bowl, CRNA Pre-anesthesia Checklist: Patient identified, Patient being monitored, Timeout performed, Emergency Drugs available and Suction available Patient Re-evaluated:Patient Re-evaluated prior to induction Oxygen Delivery Method: Circle system utilized Preoxygenation: Pre-oxygenation with 100% oxygen Induction Type: IV induction Ventilation: Mask ventilation without difficulty Laryngoscope Size: Mac and 3 Grade View: Grade I Tube type: Oral Tube size: 7.0 mm Number of attempts: 1 Airway Equipment and Method: Stylet Placement Confirmation: ETT inserted through vocal cords under direct vision,  positive ETCO2 and breath sounds checked- equal and bilateral Secured at: 21 cm Tube secured with: Tape Dental Injury: Teeth and Oropharynx as per pre-operative assessment

## 2018-01-11 NOTE — Anesthesia Preprocedure Evaluation (Addendum)
Anesthesia Evaluation    Airway Mallampati: II       Dental  (+) Teeth Intact   Pulmonary    breath sounds clear to auscultation       Cardiovascular  Rhythm:regular     Neuro/Psych    GI/Hepatic   Endo/Other    Renal/GU      Musculoskeletal   Abdominal   Peds  Hematology   Anesthesia Other Findings Denies any sig PMH other than fibroids/anemia Hgb increased over past month on Megace D/W pt and family possible need for intra/postop Tx  Reproductive/Obstetrics                             Anesthesia Physical Anesthesia Plan  ASA: II  Anesthesia Plan: General ETT   Post-op Pain Management:    Induction:   PONV Risk Score and Plan:   Airway Management Planned:   Additional Equipment:   Intra-op Plan:   Post-operative Plan:   Informed Consent:   Dental Advisory Given  Plan Discussed with: Anesthesiologist  Anesthesia Plan Comments:         Anesthesia Quick Evaluation

## 2018-01-11 NOTE — Transfer of Care (Signed)
Immediate Anesthesia Transfer of Care Note  Patient: [name]  Procedure(s) Performed: HYSTERECTOMY SUPRACERVICAL ABDOMINAL (N/A Abdomen) BILATERAL SALPINGECTOMY (Bilateral Abdomen)  Patient Location: PACU  Anesthesia Type:General  Level of Consciousness: oriented, drowsy and patient cooperative  Airway & Oxygen Therapy: Patient Spontanous Breathing and Patient connected to nasal cannula oxygen  Post-op Assessment: Report given to RN and Post -op Vital signs reviewed and stable  Post vital signs: Reviewed and stable  Last Vitals:  Vitals Value Taken Time  BP    Temp    Pulse    Resp 10 01/11/2018  1:25 PM  SpO2    Vitals shown include unvalidated device data.  Last Pain:  Vitals:   01/11/18 1040  TempSrc: Oral  PainSc: 0-No pain      Patients Stated Pain Goal: 5 (45/84/83 5075)  Complications: No apparent anesthesia complications

## 2018-01-11 NOTE — Anesthesia Postprocedure Evaluation (Signed)
Anesthesia Post Note  Patient: Gina Odonnell  Procedure(s) Performed: HYSTERECTOMY SUPRACERVICAL ABDOMINAL (N/A Abdomen) BILATERAL SALPINGECTOMY (Bilateral Abdomen)  Patient location during evaluation: PACU Anesthesia Type: General Level of consciousness: awake and alert and oriented Pain management: pain level controlled Vital Signs Assessment: post-procedure vital signs reviewed and stable Respiratory status: spontaneous breathing Cardiovascular status: stable Postop Assessment: no apparent nausea or vomiting Anesthetic complications: no     Last Vitals:  Vitals:   01/11/18 1345 01/11/18 1400  BP: 127/77 (!) 144/84  Pulse: 77 77  Resp: 15 10  Temp:    SpO2: 100% 97%    Last Pain:  Vitals:   01/11/18 1350  TempSrc:   PainSc: 7                  ADAMS, AMY A

## 2018-01-11 NOTE — Interval H&P Note (Signed)
History and Physical Interval Note:  01/11/2018 10:56 AM  Gina Odonnell  has presented today for surgery, with the diagnosis of 24 week size Fibroids Menorrhagia, Dysmenorrhea  The various methods of treatment have been discussed with the patient and family. After consideration of risks, benefits and other options for treatment, the patient has consented to  Procedure(s): HYSTERECTOMY SUPRACERVICAL ABDOMINAL (N/A) BILATERAL SALPINGECTOMY (Bilateral) as a surgical intervention .  The patient's history has been reviewed, patient examined, no change in status, stable for surgery.  I have reviewed the patient's chart and labs.  Questions were answered to the patient's satisfaction.     Florian Buff

## 2018-01-11 NOTE — H&P (Signed)
Preoperative History and Physical  Gina Odonnell is a 37 y.o. G1P1 with Patient's last menstrual period was 12/16/2017. admitted for a abdominal hysterectomy .  She has very heavy menstrual periods which has resulted in significant iron deficiency anemia and her uterus is 24 weeks size on exam due to multiple fibroids and sonogram reveals total uteerine volume >2000 cc  PMH:   History reviewed. No pertinent past medical history.  PSH:          Past Surgical History:  Procedure Laterality Date  . CHOLECYSTECTOMY      POb/GynH:              OB History    Gravida  1   Para  1   Term      Preterm      AB      Living  1     SAB      TAB      Ectopic      Multiple      Live Births  1           SH:   Social History       Tobacco Use  . Smoking status: Never Smoker  . Smokeless tobacco: Never Used  Substance Use Topics  . Alcohol use: No  . Drug use: No    FH:         Family History  Problem Relation Age of Onset  . Cancer Other   . Colon cancer Father   . Hypertension Mother   . Diabetes Mother      Allergies: No Known Allergies  Medications:       Current Outpatient Medications:  .  Ferrous Sulfate (IRON) 325 (65 Fe) MG TABS, Take 1 tablet (325 mg total) by mouth 2 (two) times daily., Disp: 30 each, Rfl: 0 .  ibuprofen (ADVIL,MOTRIN) 200 MG tablet, Take 200 mg by mouth every 6 (six) hours as needed for moderate pain., Disp: , Rfl:   Review of Systems:   Review of Systems  Constitutional: Negative for fever, chills, weight loss, malaise/fatigue and diaphoresis.  HENT: Negative for hearing loss, ear pain, nosebleeds, congestion, sore throat, neck pain, tinnitus and ear discharge.   Eyes: Negative for blurred vision, double vision, photophobia, pain, discharge and redness.  Respiratory: Negative for cough, hemoptysis, sputum production, shortness of breath, wheezing and stridor.   Cardiovascular: Negative for chest  pain, palpitations, orthopnea, claudication, leg swelling and PND.  Gastrointestinal: Positive for abdominal pain. Negative for heartburn, nausea, vomiting, diarrhea, constipation, blood in stool and melena.  Genitourinary: Negative for dysuria, urgency, frequency, hematuria and flank pain.  Musculoskeletal: Negative for myalgias, back pain, joint pain and falls.  Skin: Negative for itching and rash.  Neurological: Negative for dizziness, tingling, tremors, sensory change, speech change, focal weakness, seizures, loss of consciousness, weakness and headaches.  Endo/Heme/Allergies: Negative for environmental allergies and polydipsia. Does not bruise/bleed easily.  Psychiatric/Behavioral: Negative for depression, suicidal ideas, hallucinations, memory loss and substance abuse. The patient is not nervous/anxious and does not have insomnia.      PHYSICAL EXAM:  Blood pressure 129/81, pulse 78, height 5\' 5"  (1.651 m), weight 167 lb (75.8 kg), last menstrual period 12/16/2017.    Vitals reviewed. Constitutional: She is oriented to person, place, and time. She appears well-developed and well-nourished.  HENT:  Head: Normocephalic and atraumatic.  Right Ear: External ear normal.  Left Ear: External ear normal.  Nose: Nose normal.  Mouth/Throat: Oropharynx is clear and  moist.  Eyes: Conjunctivae and EOM are normal. Pupils are equal, round, and reactive to light. Right eye exhibits no discharge. Left eye exhibits no discharge. No scleral icterus.  Neck: Normal range of motion. Neck supple. No tracheal deviation present. No thyromegaly present.  Cardiovascular: Normal rate, regular rhythm, normal heart sounds and intact distal pulses.  Exam reveals no gallop and no friction rub.   No murmur heard. Respiratory: Effort normal and breath sounds normal. No respiratory distress. She has no wheezes. She has no rales. She exhibits no tenderness.  GI: Soft. Bowel sounds are normal. She exhibits no  distension and no mass. There is tenderness. There is no rebound and no guarding.  Genitourinary:       Vulva is normal without lesions Vagina is pink moist without discharge Cervix normal in appearance and pap is normal Uterus is 24 weeks size Adnexa is negative with normal sized ovaries by sonogram  Musculoskeletal: Normal range of motion. She exhibits no edema and no tenderness.  Neurological: She is alert and oriented to person, place, and time. She has normal reflexes. She displays normal reflexes. No cranial nerve deficit. She exhibits normal muscle tone. Coordination normal.  Skin: Skin is warm and dry. No rash noted. No erythema. No pallor.  Psychiatric: She has a normal mood and affect. Her behavior is normal. Judgment and thought content normal.    Labs: Results for orders placed or performed during the hospital encounter of 01/10/18 (from the past 168 hour(s))  CBC   Collection Time: 01/10/18  2:31 PM  Result Value Ref Range   WBC 5.4 4.0 - 10.5 K/uL   RBC 4.68 3.87 - 5.11 MIL/uL   Hemoglobin 10.4 (L) 12.0 - 15.0 g/dL   HCT 33.2 (L) 36.0 - 46.0 %   MCV 70.9 (L) 80.0 - 100.0 fL   MCH 22.2 (L) 26.0 - 34.0 pg   MCHC 31.3 30.0 - 36.0 g/dL   RDW 26.3 (H) 11.5 - 15.5 %   Platelets 405 (H) 150 - 400 K/uL   nRBC 0.0 0.0 - 0.2 %  Comprehensive metabolic panel   Collection Time: 01/10/18  2:31 PM  Result Value Ref Range   Sodium 138 135 - 145 mmol/L   Potassium 3.8 3.5 - 5.1 mmol/L   Chloride 109 98 - 111 mmol/L   CO2 24 22 - 32 mmol/L   Glucose, Bld 89 70 - 99 mg/dL   BUN 8 6 - 20 mg/dL   Creatinine, Ser 0.58 0.44 - 1.00 mg/dL   Calcium 8.6 (L) 8.9 - 10.3 mg/dL   Total Protein 7.5 6.5 - 8.1 g/dL   Albumin 3.8 3.5 - 5.0 g/dL   AST 22 15 - 41 U/L   ALT 24 0 - 44 U/L   Alkaline Phosphatase 60 38 - 126 U/L   Total Bilirubin 0.2 (L) 0.3 - 1.2 mg/dL   GFR calc non Af Amer >60 >60 mL/min   GFR calc Af Amer >60 >60 mL/min   Anion gap 5 5 - 15  hCG, quantitative, pregnancy    Collection Time: 01/10/18  2:31 PM  Result Value Ref Range   hCG, Beta Chain, Quant, S <1 <5 mIU/mL  Rapid HIV screen (HIV 1/2 Ab+Ag)   Collection Time: 01/10/18  2:31 PM  Result Value Ref Range   HIV-1 P24 Antigen - HIV24 NON REACTIVE NON REACTIVE   HIV 1/2 Antibodies NON REACTIVE NON REACTIVE   Interpretation (HIV Ag Ab)      A  non reactive test result means that HIV 1 or HIV 2 antibodies and HIV 1 p24 antigen were not detected in the specimen.  Urinalysis, Routine w reflex microscopic   Collection Time: 01/10/18  2:31 PM  Result Value Ref Range   Color, Urine YELLOW YELLOW   APPearance HAZY (A) CLEAR   Specific Gravity, Urine 1.026 1.005 - 1.030   pH 5.0 5.0 - 8.0   Glucose, UA NEGATIVE NEGATIVE mg/dL   Hgb urine dipstick LARGE (A) NEGATIVE   Bilirubin Urine NEGATIVE NEGATIVE   Ketones, ur NEGATIVE NEGATIVE mg/dL   Protein, ur NEGATIVE NEGATIVE mg/dL   Nitrite NEGATIVE NEGATIVE   Leukocytes, UA NEGATIVE NEGATIVE   RBC / HPF 21-50 0 - 5 RBC/hpf   WBC, UA 0-5 0 - 5 WBC/hpf   Bacteria, UA RARE (A) NONE SEEN   Squamous Epithelial / LPF 0-5 0 - 5   Mucus PRESENT   Type and screen   Collection Time: 01/10/18  2:31 PM  Result Value Ref Range   ABO/RH(D) A NEG    Antibody Screen NEG    Sample Expiration 01/24/2018    Unit Number [id number]    Blood Component Type RED CELLS,LR    Unit division 00    Status of Unit ALLOCATED    Transfusion Status OK TO TRANSFUSE    Crossmatch Result      Compatible Performed at [facility], [address] [place], [place] [address]    Unit Number [id number]    Blood Component Type RED CELLS,LR    Unit division 00    Status of Unit ALLOCATED    Transfusion Status OK TO TRANSFUSE    Crossmatch Result Compatible   Prepare RBC   Collection Time: 01/10/18  2:31 PM  Result Value Ref Range   Order Confirmation      ORDER PROCESSED BY BLOOD BANK Performed at [facility], [address], [place], [place] [address]   ABO/Rh    Collection Time: 01/10/18  2:31 PM  Result Value Ref Range   ABO/RH(D)      A NEG Performed at [facility], [address] [place], [place] [address]   BPAM [facility]   Collection Time: 01/10/18  2:31 PM  Result Value Ref Range   Blood Product Unit Number [id number]    Unit Type and Rh 0600    Blood Product Expiration Date 510258527782    Blood Product Unit Number [id number]    Unit Type and Rh 9500    Blood Product Expiration Date 008676195093      EKG: No orders found for this or any previous visit.  Imaging Studies:  Imaging Results  US Pelvis Transvanginal Non-ob (tv Only)  Result Date: 12/13/2017 GYNECOLOGIC SONOGRAM Poetry Cerro is a 37 y.o. G1P1 LMP 11/25/2017,she is here for a pelvic sonogram for enlarged uterus w/menorrhagia. Uterus                      16.2 x 14 x 17.8 cm, vol 2111 ml, enlarged heterogeneous uterus w/mult.fibroids Endometrium          19 mm, symmetrical, wnl Right ovary             3.2 x 2.3 x 2 cm, wnl Left ovary                4.1 x 1.4 x 2.6 cm, wnl Fibroids:                 (#1) subserosal  anterior fibroid 6.5 x 9.8 x 9.8 cm,(#2) anterior subserosal 8.8 x 7.7 x 6.1 cm,(#3) posterior right subserosal 6.3 x 5.5 x 5.5 cm Technician Comments: PELVIC US TA/TV: enlarged heterogeneous uterus w/mult.fibroids, largest fibroids: (#1) subserosal anterior fibroid 6.5 x 9.8 x 9.8 cm,(#2) anterior subserosal 8.8 x 7.7 x 6.1 cm,(#3) posterior right subserosal 6.3 x 5.5 x 5.5 cm,EEC 19 mm,normal ovaries bilat,no free fluid,no pain during ultrasound,limited view w/TV, best imaged w/T/A ultrasound. Amber Heide Guile 12/09/2017 10:15 AM Clinical Impression and recommendations: I have reviewed the sonogram results above, combined with the patient's current clinical course, below are my impressions and any appropriate recommendations for management based on the sonographic findings. Uterus is dramatically enlarged due to fibroids Endometrium is distorted but essentially normal  for a menstruating woman Ovaries are normal Florian Buff 12/13/2017 2:43 PM   US Pelvis (transabdominal Only)  Result Date: 12/13/2017 GYNECOLOGIC SONOGRAM Lark Langenfeld is a 37 y.o. G1P1 LMP 11/25/2017,she is here for a pelvic sonogram for enlarged uterus w/menorrhagia. Uterus                      16.2 x 14 x 17.8 cm, vol 2111 ml, enlarged heterogeneous uterus w/mult.fibroids Endometrium          19 mm, symmetrical, wnl Right ovary             3.2 x 2.3 x 2 cm, wnl Left ovary                4.1 x 1.4 x 2.6 cm, wnl Fibroids:                 (#1) subserosal anterior fibroid 6.5 x 9.8 x 9.8 cm,(#2) anterior subserosal 8.8 x 7.7 x 6.1 cm,(#3) posterior right subserosal 6.3 x 5.5 x 5.5 cm Technician Comments: PELVIC US TA/TV: enlarged heterogeneous uterus w/mult.fibroids, largest fibroids: (#1) subserosal anterior fibroid 6.5 x 9.8 x 9.8 cm,(#2) anterior subserosal 8.8 x 7.7 x 6.1 cm,(#3) posterior right subserosal 6.3 x 5.5 x 5.5 cm,EEC 19 mm,normal ovaries bilat,no free fluid,no pain during ultrasound,limited view w/TV, best imaged w/T/A ultrasound. Amber Heide Guile 12/09/2017 10:15 AM Clinical Impression and recommendations: I have reviewed the sonogram results above, combined with the patient's current clinical course, below are my impressions and any appropriate recommendations for management based on the sonographic findings. Uterus is dramatically enlarged due to fibroids Endometrium is distorted but essentially normal for a menstruating woman Ovaries are normal Florian Buff 12/13/2017 2:43 PM       Assessment: 24 week size fibroid uterus, sonogram volume 2111 cc Anemia, due to menorrhagia      Patient Active Problem List   Diagnosis Date Noted  . Encounter for gynecological examination with Papanicolaou smear of cervix 12/05/2017  . Enlarged uterus 12/05/2017  . Menorrhagia with regular cycle 12/05/2017    Plan: Abdominal supracervical hysterectomy with removal of both tubes  Pt and  mom deciding on the timing Pt understands a blood transfusion pre op or intra op or post op is veery likely  Pt understands the risks of surgery including but not limited t  excessive bleeding requiring transfusion or reoperation, post-operative infection requiring prolonged hospitalization or re-hospitalization and antibiotic therapy, and damage to other organs including bladder, bowel, ureters and major vessels.  The patient also understands the alternative treatment options which were discussed in full.  All questions were answered.    Florian Buff 12/21/2017 3:24 PM

## 2018-01-11 NOTE — Op Note (Signed)
Preoperative diagnosis:  1.  24 week size fibroid uterus, 2110 cc volume                                          2.  Menorrhagia                                         3.  Iron deficiency anemia                                         4.  dysmenorrhea  Postoperative diagnosis:  Same as above  Procedure:  Abdominal hysterectomy, supracervical + removal of Fallopian tubes  Surgeon:  Florian Buff  Assistant:    Anesthesia:  General endotracheal  Preoperative clinical summary:  Large fibroids, 24 weeks size, hemoglobin 1 month ago 8.6  Intraoperative findings: confirms pre operative findings, normal ovaries and tubes  Description of operation:  Patient was taken to the operating room and placed in the supine position where she underwent general endotracheal anesthesia.  She was then prepped and draped in the usual sterile fashion and a Foley catheter was placed for continuous bladder drainage.  A Pfannenstiel skin incision was made and carried down sharply to the rectus fascia which was scored in the midline and extended laterally.  The fascia was taken off the muscles superiorly and inferiorly without difficulty.  The muscles were divided.  The peritoneal cavity was entered. The uterus was delivered through the abdominal incision.   The upper abdomen was packed away. Both uterine cornu were grasped with Coker clamps.  The left round ligament was suture ligated and coagulated with the electrocautery unit.  The left vesicouterine serosal flap was created.  An avascular window in in the peritoneum was created and the utero-ovarian ligament was cross clamped, cut and suture ligated.  The right round ligament was suture ligated and cut with the electrocautery unit.  The vesicouterine serosal flap on the right was created.  An avascular window in the peritoneum was created and the right utero-ovarian ligament was cross clamped, cut and double suture ligated.  Thus both ovaries were preserved.  The  uterine vessels were skeletonized bilaterally.  The uterine vessels were clamped bilaterally,  then cut and suture ligated.  Two more pedicles were taken down the cervix medial to the uterine vessels.  Each pedicle was clamped cut and suture ligated with good resulting hemostasis.  As per the preoperative plan the cervix was then transected sharply and the specimen was removed.  The cervical stump was then closed anterior to posterior for hemostasis and reduce postoperative adhesions.  The periotneum was closed from ovary to ovary. The pelvis was irrigated vigorously and all pedicles were examined and found to be hemostatic.  Both Fallopian tubes were removed hemostatically.   All specimens were sent to pathology for routine evaluation.   the pelvis was irrigated vigorously.  All packs were removed and all counts were correct at this point x 3.  The muscles and peritoneum were reapproximated loosely.  The fascia was closed with 0 Vicryl running.   The skin was closed using 3-0 Vicryl on a Keith needle in a subcuticular fashion.  Dermabond was then applied  for additional wound integrity and to serve as a postoperative bacterial barrier.  The patient was awakened from anesthesia taken to the recovery room in good stable condition. All sponge instrument and needle counts were correct x 3.  The patient received Ancef and Toradol prophylactically preoperatively.  Estimated blood loss for the procedure was 500  cc.  Gina Odonnell,Gina Odonnell 01/11/2018 1:20 PM

## 2018-01-12 ENCOUNTER — Encounter (HOSPITAL_COMMUNITY): Payer: Self-pay | Admitting: Obstetrics & Gynecology

## 2018-01-12 LAB — BASIC METABOLIC PANEL
Anion gap: 7 (ref 5–15)
BUN: 6 mg/dL (ref 6–20)
CO2: 24 mmol/L (ref 22–32)
CREATININE: 0.57 mg/dL (ref 0.44–1.00)
Calcium: 8.3 mg/dL — ABNORMAL LOW (ref 8.9–10.3)
Chloride: 103 mmol/L (ref 98–111)
GFR calc Af Amer: >60 mL/min (ref >60)
GFR calc non Af Amer: >60 mL/min (ref >60)
Glucose, Bld: 137 mg/dL — ABNORMAL HIGH (ref 70–99)
Potassium: 3.7 mmol/L (ref 3.5–5.1)
SODIUM: 134 mmol/L — AB (ref 135–145)

## 2018-01-12 LAB — CBC
HCT: 29.9 % — ABNORMAL LOW (ref 36.0–46.0)
HEMOGLOBIN: 9.7 g/dL — AB (ref 12.0–15.0)
MCH: 22.9 pg — ABNORMAL LOW (ref 26.0–34.0)
MCHC: 32.4 g/dL (ref 30.0–36.0)
MCV: 70.5 fL — ABNORMAL LOW (ref 80.0–100.0)
Platelets: 397 10*3/uL (ref 150–400)
RBC: 4.24 MIL/uL (ref 3.87–5.11)
RDW: 25.1 % — ABNORMAL HIGH (ref 11.5–15.5)
WBC: 9 10*3/uL (ref 4.0–10.5)
nRBC: 0 % (ref 0.0–0.2)

## 2018-01-12 MED ORDER — ONDANSETRON HCL 8 MG PO TABS: 8.0000 mg | ORAL_TABLET | Freq: Four times a day (QID) | ORAL | 0 refills | Status: DC | PRN

## 2018-01-12 MED ORDER — OXYCODONE-ACETAMINOPHEN 7.5-325 MG PO TABS: 1.0000 | ORAL_TABLET | ORAL | 0 refills | Status: DC | PRN

## 2018-01-12 MED ORDER — OXYCODONE-ACETAMINOPHEN 7.5-325 MG PO TABS
1.0000 | ORAL_TABLET | ORAL | Status: DC | PRN
  Administered 2018-01-12 (×2): 2 via ORAL
  Filled 2018-01-12 (×2): qty 2

## 2018-01-12 MED ORDER — KETOROLAC TROMETHAMINE 10 MG PO TABS: 10.0000 mg | ORAL_TABLET | Freq: Three times a day (TID) | ORAL | 0 refills | Status: DC | PRN

## 2018-01-12 NOTE — Discharge Summary (Signed)
Physician Discharge Summary  Patient ID: Gina Odonnell MRN: [id number] DOB/AGE: 1980-04-26 37 y.o.  Admit date: 01/11/2018 Discharge date: 01/12/2018  Admission Diagnoses: 24 week fibroid uterus  Discharge Diagnoses:  Active Problems:   S/P hysterectomy   Discharged Condition: good  Hospital Course: unremarkable  Consults: None  Significant Diagnostic Studies: labs:   Results for orders placed or performed during the hospital encounter of 01/11/18 (from the past 24 hour(s))  Hemoglobin-hemacue, POC     Status: Abnormal   Collection Time: 01/11/18  1:34 PM  Result Value Ref Range   Hemoglobin 7.9 (L) 12.0 - 15.0 g/dL  CBC     Status: Abnormal   Collection Time: 01/12/18  4:42 AM  Result Value Ref Range   WBC 9.0 4.0 - 10.5 K/uL   RBC 4.24 3.87 - 5.11 MIL/uL   Hemoglobin 9.7 (L) 12.0 - 15.0 g/dL   HCT 29.9 (L) 36.0 - 46.0 %   MCV 70.5 (L) 80.0 - 100.0 fL   MCH 22.9 (L) 26.0 - 34.0 pg   MCHC 32.4 30.0 - 36.0 g/dL   RDW 25.1 (H) 11.5 - 15.5 %   Platelets 397 150 - 400 K/uL   nRBC 0.0 0.0 - 0.2 %  Basic metabolic panel     Status: Abnormal   Collection Time: 01/12/18  4:42 AM  Result Value Ref Range   Sodium 134 (L) 135 - 145 mmol/L   Potassium 3.7 3.5 - 5.1 mmol/L   Chloride 103 98 - 111 mmol/L   CO2 24 22 - 32 mmol/L   Glucose, Bld 137 (H) 70 - 99 mg/dL   BUN 6 6 - 20 mg/dL   Creatinine, Ser 0.57 0.44 - 1.00 mg/dL   Calcium 8.3 (L) 8.9 - 10.3 mg/dL   GFR calc non Af Amer >60 >60 mL/min   GFR calc Af Amer >60 >60 mL/min   Anion gap 7 5 - 15      Treatments: surgery: supracervical TAH  Discharge Exam: Blood pressure 119/80, pulse 87, temperature 98.2 F (36.8 C), temperature source Oral, resp. rate 16, last menstrual period 01/06/2018, SpO2 98 %. General appearance: alert, cooperative and no distress GI: soft, non-tender; bowel sounds normal; no masses,  no organomegaly Incision/Wound:clean dry intact  Disposition: Discharge disposition: 01-Home or Self  Care       Discharge Instructions    Call MD for:  persistant nausea and vomiting   Complete by:  As directed    Call MD for:  severe uncontrolled pain   Complete by:  As directed    Call MD for:  temperature >100.4   Complete by:  As directed    Diet - low sodium heart healthy   Complete by:  As directed    Driving Restrictions   Complete by:  As directed    No driving for 1 week   Increase activity slowly   Complete by:  As directed    Leave dressing on - Keep it clean, dry, and intact until clinic visit   Complete by:  As directed    Lifting restrictions   Complete by:  As directed    Do not lift more than 10 pounds for 6 weeks   Sexual Activity Restrictions   Complete by:  As directed    No sex for 6 weeks     Allergies as of 01/12/2018   No Known Allergies     Medication List    STOP taking these medications   ibuprofen 200  MG tablet Commonly known as:  ADVIL,MOTRIN   Iron 325 (65 Fe) MG Tabs   megestrol 40 MG tablet Commonly known as:  MEGACE     TAKE these medications   ketorolac 10 MG tablet Commonly known as:  TORADOL Take 1 tablet (10 mg total) by mouth every 8 (eight) hours as needed.   ondansetron 8 MG tablet Commonly known as:  ZOFRAN Take 1 tablet (8 mg total) by mouth every 6 (six) hours as needed for nausea or vomiting.   oxyCODONE-acetaminophen 7.5-325 MG tablet Commonly known as:  PERCOCET Take 1-2 tablets by mouth every 4 (four) hours as needed for moderate pain or severe pain.      Follow-up Information    Florian Buff, MD Follow up on 01/19/2018.   Specialties:  Obstetrics and Gynecology, Radiology Why:  post op Contact information: [place] [address] [phone]           Signed: Florian Buff 01/12/2018, 1:10 PM

## 2018-01-12 NOTE — Progress Notes (Signed)
Removed IV-clean, dry, intact. Reviewed d/c paperwork with patient and family. Answered all questions. [place] wheeled stable patient and belongings to main entrance.

## 2018-01-12 NOTE — Discharge Instructions (Signed)
Abdominal Hysterectomy, Care After °This sheet gives you information about how to care for yourself after your procedure. Your health care provider may also give you more specific instructions. If you have problems or questions, contact your health care provider. °What can I expect after the procedure? °After your procedure, it is common to have: °· Pain. °· Fatigue. °· Poor appetite. °· Less interest in sex. °· Vaginal bleeding and discharge. You may need to use a sanitary napkin after this procedure. ° °Follow these instructions at home: °Bathing °· Do not take baths, swim, or use a hot tub until your health care provider approves. Ask your health care provider if you can take showers. You may only be allowed to take sponge baths for bathing. °· Keep the bandage (dressing) dry until your health care provider says it can be removed. °Incision care °· Follow instructions from your health care provider about how to take care of your incision. Make sure you: °? Wash your hands with soap and water before you change your bandage (dressing). If soap and water are not available, use hand sanitizer. °? Change your dressing as told by your health care provider. °? Leave stitches (sutures), skin glue, or adhesive strips in place. These skin closures may need to stay in place for 2 weeks or longer. If adhesive strip edges start to loosen and curl up, you may trim the loose edges. Do not remove adhesive strips completely unless your health care provider tells you to do that. °· Check your incision area every day for signs of infection. Check for: °? Redness, swelling, or pain. °? Fluid or blood. °? Warmth. °? Pus or a bad smell. °Activity °· Do gentle, daily exercises as told by your health care provider. You may be told to take short walks every day and go farther each time. °· Do not lift anything that is heavier than 10 lb (4.5 kg), or the limit that your health care provider tells you, until he or she says that it is  safe. °· Do not drive or use heavy machinery while taking prescription pain medicine. °· Do not drive for 24 hours if you were given a medicine to help you relax (sedative). °· Follow your health care provider's instructions about exercise, driving, and general activities. Ask your health care provider what activities are safe for you. °Lifestyle °· Do not douche, use tampons, or have sex for at least 6 weeks or as told by your health care provider. °· Do not drink alcohol until your health care provider approves. °· Drink enough fluid to keep your urine clear or pale yellow. °· Try to have someone at home with you for the first 1-2 weeks to help. °· Do not use any products that contain nicotine or tobacco, such as cigarettes and e-cigarettes. These can delay healing. If you need help quitting, ask your health care provider. °General instructions °· Take over-the-counter and prescription medicines only as told by your health care provider. °· Do not take aspirin or ibuprofen. These medicines can cause bleeding. °· To prevent or treat constipation while you are taking prescription pain medicine, your health care provider may recommend that you: °? Drink enough fluid to keep your urine clear or pale yellow. °? Take over-the-counter or prescription medicines. °? Eat foods that are high in fiber, such as fresh fruits and vegetables, whole grains, and beans. °? Limit foods that are high in fat and processed sugars, such as fried and sweet foods. °· Keep all   follow-up visits as told by your health care provider. This is important. °Contact a health care provider if: °· You have chills or fever. °· You have redness, swelling, or pain around your incision. °· You have fluid or blood coming from your incision. °· Your incision feels warm to the touch. °· You have pus or a bad smell coming from your incision. °· Your incision breaks open. °· You feel dizzy or light-headed. °· You have pain or bleeding when you urinate. °· You  have persistent diarrhea. °· You have persistent nausea and vomiting. °· You have abnormal vaginal discharge. °· You have a rash. °· You have any type of abnormal reaction or you develop an allergy to your medicine. °· Your pain medicine does not help. °Get help right away if: °· You have a fever and your symptoms suddenly get worse. °· You have severe abdominal pain. °· You have shortness of breath. °· You faint. °· You have pain, swelling, or redness in your leg. °· You have heavy vaginal bleeding with blood clots. °Summary °· After your procedure, it is common to have pain, fatigue and vaginal discharge. °· Do not take baths, swim, or use a hot tub until your health care provider approves. Ask your health care provider if you can take showers. You may only be allowed to take sponge baths for bathing. °· Follow your health care provider's instructions about exercise, driving, and general activities. Ask your health care provider what activities are safe for you. °· Do not lift anything that is heavier than 10 lb (4.5 kg), or the limit that your health care provider tells you, until he or she says that it is safe. °· Try to have someone at home with you for the first 1-2 weeks to help. °This information is not intended to replace advice given to you by your health care provider. Make sure you discuss any questions you have with your health care provider. °Document Released: 08/14/2004 Document Revised: 01/14/2016 Document Reviewed: 01/14/2016 °Elsevier Interactive Patient Education © 2017 [employer] ° °

## 2018-01-16 LAB — TYPE AND SCREEN
ABO/RH(D): A NEG
Antibody Screen: NEGATIVE
UNIT DIVISION: 0
Unit division: 0

## 2018-01-16 LAB — BPAM RBC
Blood Product Expiration Date: 201912162359
Blood Product Expiration Date: 201912232359
UNIT TYPE AND RH: 600
Unit Type and Rh: 9500

## 2018-01-19 ENCOUNTER — Other Ambulatory Visit: Payer: Self-pay

## 2018-01-19 ENCOUNTER — Ambulatory Visit (INDEPENDENT_AMBULATORY_CARE_PROVIDER_SITE_OTHER): Payer: BC Managed Care – PPO | Admitting: Obstetrics & Gynecology

## 2018-01-19 ENCOUNTER — Other Ambulatory Visit (HOSPITAL_COMMUNITY): Payer: Self-pay | Admitting: Obstetrics & Gynecology

## 2018-01-19 ENCOUNTER — Encounter: Payer: Self-pay | Admitting: Obstetrics & Gynecology

## 2018-01-19 VITALS — BP 113/72 | HR 95 | Ht 65.0 in | Wt 161.0 lb

## 2018-01-19 DIAGNOSIS — Z90710 Acquired absence of both cervix and uterus: Secondary | ICD-10-CM

## 2018-01-19 MED ORDER — OXYCODONE-ACETAMINOPHEN 5-325 MG PO TABS: 1.0000 | ORAL_TABLET | ORAL | 0 refills | Status: DC | PRN

## 2018-01-19 MED ORDER — KETOROLAC TROMETHAMINE 10 MG PO TABS: 10.0000 mg | ORAL_TABLET | Freq: Three times a day (TID) | ORAL | 0 refills | Status: DC | PRN

## 2018-01-19 NOTE — Progress Notes (Signed)
  HPI: Patient returns for routine postoperative follow-up having undergone supracervical hysterectomy on 01/11/2018.  The patient's immediate postoperative recovery has been unremarkable. Since hospital discharge the patient reports normal post op course.   Current Outpatient Medications: ketorolac (TORADOL) 10 MG tablet, Take 1 tablet (10 mg total) by mouth every 8 (eight) hours as needed., Disp: 15 tablet, Rfl: 0 ondansetron (ZOFRAN) 8 MG tablet, Take 1 tablet (8 mg total) by mouth every 6 (six) hours as needed for nausea or vomiting., Disp: 20 tablet, Rfl: 0 oxyCODONE-acetaminophen (PERCOCET) 7.5-325 MG tablet, Take 1-2 tablets by mouth every 4 (four) hours as needed for moderate pain or severe pain., Disp: 30 tablet, Rfl: 0  No current facility-administered medications for this visit.     Blood pressure 113/72, pulse 95, height 5\' 5"  (1.651 m), weight 161 lb (73 kg), last menstrual period 01/06/2018.  Physical Exam: Incision clean dry intact  Diagnostic Tests:   Pathology: benign  Impression: S/p abdominal supracervical hysterectomy  Plan: Routine post op care  Follow up:   Meds ordered this encounter  Medications  . oxyCODONE-acetaminophen (PERCOCET) 5-325 MG tablet    Sig: Take 1-2 tablets by mouth every 4 (four) hours as needed for severe pain. No more than 6 per day    Dispense:  30 tablet    Refill:  0    Pt is post op  . ketorolac (TORADOL) 10 MG tablet    Sig: Take 1 tablet (10 mg total) by mouth every 8 (eight) hours as needed.    Dispense:  15 tablet    Refill:  0    4 weeks  Florian Buff, MD

## 2018-02-10 ENCOUNTER — Other Ambulatory Visit (HOSPITAL_COMMUNITY): Payer: BC Managed Care – PPO

## 2018-02-16 ENCOUNTER — Encounter (INDEPENDENT_AMBULATORY_CARE_PROVIDER_SITE_OTHER): Payer: Self-pay

## 2018-02-16 ENCOUNTER — Ambulatory Visit (INDEPENDENT_AMBULATORY_CARE_PROVIDER_SITE_OTHER): Payer: BC Managed Care – PPO | Admitting: Obstetrics & Gynecology

## 2018-02-16 ENCOUNTER — Other Ambulatory Visit: Payer: Self-pay

## 2018-02-16 ENCOUNTER — Encounter: Payer: Self-pay | Admitting: Obstetrics & Gynecology

## 2018-02-16 VITALS — BP 114/79 | HR 78 | Ht 64.0 in | Wt 162.0 lb

## 2018-02-16 DIAGNOSIS — Z90710 Acquired absence of both cervix and uterus: Secondary | ICD-10-CM

## 2018-02-16 NOTE — Progress Notes (Signed)
  HPI: Patient returns for routine postoperative follow-up having undergone supracervical abdominal hysterectomy on 01/11/2018.  The patient's immediate postoperative recovery has been unremarkable. Since hospital discharge the patient reports no problems.   Current Outpatient Medications: ketorolac (TORADOL) 10 MG tablet, TAKE 1 TABLET BY MOUTH EVERY 8 HOURS AS NEEDED (Patient not taking: Reported on 02/16/2018), Disp: 15 tablet, Rfl: 0 ondansetron (ZOFRAN) 8 MG tablet, Take 1 tablet (8 mg total) by mouth every 6 (six) hours as needed for nausea or vomiting. (Patient not taking: Reported on 02/16/2018), Disp: 20 tablet, Rfl: 0 oxyCODONE-acetaminophen (PERCOCET) 5-325 MG tablet, Take 1-2 tablets by mouth every 4 (four) hours as needed for severe pain. No more than 6 per day (Patient not taking: Reported on 02/16/2018), Disp: 30 tablet, Rfl: 0  No current facility-administered medications for this visit.     Blood pressure 114/79, pulse 78, height 5\' 4"  (1.626 m), weight 162 lb (73.5 kg), last menstrual period 01/06/2018.  Physical Exam: Incision clean dry intact Vaginal exam normal supracervical hysterectomy  Diagnostic Tests:   Pathology: benign  Impression: S/p supracervicl abdominal hysterectomy  Plan: Pap  Follow up: 1  years  Florian Buff, MD

## 2018-02-23 ENCOUNTER — Encounter: Payer: BC Managed Care – PPO | Admitting: Obstetrics & Gynecology

## 2018-11-10 ENCOUNTER — Telehealth: Payer: Self-pay | Admitting: Adult Health

## 2018-11-10 NOTE — Telephone Encounter (Signed)

## 2018-11-13 ENCOUNTER — Ambulatory Visit: Payer: BC Managed Care – PPO | Admitting: Adult Health

## 2018-11-14 ENCOUNTER — Telehealth: Payer: Self-pay | Admitting: Adult Health

## 2018-11-14 NOTE — Telephone Encounter (Signed)
We have you scheduled for an upcoming appointment at our office. At this time, we are still not allowing visitors or children during your appointment, however, a support person, over age 40, may accompany you to your appointment if assistance is needed for safety or care concerns. Otherwise, support persons should remain outside until the visit is complete.   We ask if you have had any exposure to anyone suspected or confirmed of having COVID-19 or if you are experiencing any of the following, to call and reschedule your appointment: fever, cough, shortness of breath, muscle pain, diarrhea, rash, vomiting, abdominal pain, red eye, weakness, bruising, bleeding, joint pain, or a severe headache.   Please know we will ask you these questions or similar questions when you arrive for your appointment and again its how we are keeping everyone safe.    Also,to keep you safe, please use the provided hand sanitizer when you enter the office. We are asking everyone in the office to wear a mask to help prevent the spread of germs. If you have a mask of your own, please wear it to your appointment, if not, we are happy to provide one for you.  Thank you for understanding and your cooperation.    [facility] Staff

## 2018-11-15 ENCOUNTER — Ambulatory Visit (INDEPENDENT_AMBULATORY_CARE_PROVIDER_SITE_OTHER): Payer: BC Managed Care – PPO | Admitting: Adult Health

## 2018-11-15 ENCOUNTER — Encounter: Payer: Self-pay | Admitting: Adult Health

## 2018-11-15 ENCOUNTER — Other Ambulatory Visit: Payer: Self-pay

## 2018-11-15 VITALS — BP 105/65 | HR 75 | Ht 65.0 in | Wt 153.0 lb

## 2018-11-15 DIAGNOSIS — N898 Other specified noninflammatory disorders of vagina: Secondary | ICD-10-CM | POA: Diagnosis not present

## 2018-11-15 MED ORDER — PREMARIN 0.625 MG/GM VA CREA: TOPICAL_CREAM | VAGINAL | 0 refills | Status: DC

## 2018-11-15 NOTE — Progress Notes (Signed)
  Subjective:     Patient ID: Gina Odonnell, female   DOB: September 18, 1980, 38 y.o.   MRN: [id number]  HPI Nidra is a 38 year old Hispanic female, divorced, sp [facility] 01/11/18 for fibroids and menorrhagia, in complaing if vaginal dryness and pain in vagina at times and feels bloated and gassy at times.  Review of Systems +vaginal dryness + pain in vagina at times +feels bloated, gassy at times    Objective:   Physical Exam BP 105/65 (BP Location: Right Arm, Patient Position: Sitting, Cuff Size: Normal)   Pulse 75   Ht 5\' 5"  (1.651 m)   Wt 153 lb (69.4 kg)   LMP 01/06/2018 Comment: [place]  BMI 25.46 kg/m   Skin warm and dry.Pelvic: external genitalia is normal in appearance no lesions, vagina: pink, with some loss of moisture but good rugae,urethra has no lesions or masses noted, cervix:smooth and bulbous, uterus is absent, adnexa: no masses or tenderness noted. Bladder is non tender and no masses felt.  Examination chaperoned by Diona Fanti CMA.     Assessment:     Vaginal dryness     Plan:     Given 4 tubes PVC Use o.5 gm in vagina 2 x weekly Use astroglide or KY jelly if has sex Try to eat then drink to see if helps with gas Try to sit up straight Follow up prn

## 2019-05-14 ENCOUNTER — Ambulatory Visit (INDEPENDENT_AMBULATORY_CARE_PROVIDER_SITE_OTHER): Payer: Self-pay

## 2019-05-14 ENCOUNTER — Other Ambulatory Visit: Payer: Self-pay

## 2019-05-14 ENCOUNTER — Encounter (HOSPITAL_COMMUNITY): Payer: Self-pay

## 2019-05-14 ENCOUNTER — Ambulatory Visit (HOSPITAL_COMMUNITY)
Admission: EM | Admit: 2019-05-14 | Discharge: 2019-05-14 | Disposition: A | Discharge status: Home / Self Care | Payer: Self-pay | Attending: Family Medicine | Admitting: Family Medicine

## 2019-05-14 DIAGNOSIS — S161XXA Strain of muscle, fascia and tendon at neck level, initial encounter: Secondary | ICD-10-CM

## 2019-05-14 DIAGNOSIS — M5412 Radiculopathy, cervical region: Secondary | ICD-10-CM

## 2019-05-14 DIAGNOSIS — M542 Cervicalgia: Secondary | ICD-10-CM

## 2019-05-14 MED ORDER — TIZANIDINE HCL 4 MG PO TABS: 4.0000 mg | ORAL_TABLET | Freq: Four times a day (QID) | ORAL | 0 refills | Status: DC | PRN

## 2019-05-14 MED ORDER — METHYLPREDNISOLONE 4 MG PO TBPK: ORAL_TABLET | ORAL | 0 refills | Status: DC
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
## 2019-05-14 NOTE — Discharge Instructions (Signed)
Take the prednisone as directed This is an anti-inflammatory medicine to reduce inflammation of the nerve Take Zanaflex as needed.  This is a muscle relaxer.  It is useful to take this at bedtime Put ice or heat to the painful neck muscles Reduced activity for a few days See an orthopedist if your pain persists
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
## 2019-07-06 ENCOUNTER — Other Ambulatory Visit: Payer: Self-pay | Admitting: Sports Medicine

## 2019-07-06 DIAGNOSIS — M542 Cervicalgia: Secondary | ICD-10-CM

## 2019-08-09 ENCOUNTER — Other Ambulatory Visit: Payer: Self-pay

## 2019-09-01 ENCOUNTER — Ambulatory Visit
Admission: RE | Admit: 2019-09-01 | Discharge: 2019-09-01 | Disposition: A | Discharge status: Home / Self Care | Payer: Self-pay | Source: Ambulatory Visit | Attending: Sports Medicine | Admitting: Sports Medicine

## 2019-09-01 DIAGNOSIS — M542 Cervicalgia: Secondary | ICD-10-CM

## 2019-09-03 ENCOUNTER — Emergency Department (HOSPITAL_COMMUNITY): Payer: Medicaid Other

## 2019-09-03 ENCOUNTER — Emergency Department (HOSPITAL_COMMUNITY)
Admission: EM | Admit: 2019-09-03 | Discharge: 2019-09-03 | Disposition: A | Discharge status: Home / Self Care | Payer: Medicaid Other | Attending: Emergency Medicine | Admitting: Emergency Medicine

## 2019-09-03 ENCOUNTER — Other Ambulatory Visit: Payer: Self-pay

## 2019-09-03 DIAGNOSIS — Z20822 Contact with and (suspected) exposure to covid-19: Secondary | ICD-10-CM | POA: Insufficient documentation

## 2019-09-03 DIAGNOSIS — Z800 Family history of malignant neoplasm of digestive organs: Secondary | ICD-10-CM | POA: Diagnosis not present

## 2019-09-03 DIAGNOSIS — R0789 Other chest pain: Secondary | ICD-10-CM | POA: Diagnosis not present

## 2019-09-03 DIAGNOSIS — Z809 Family history of malignant neoplasm, unspecified: Secondary | ICD-10-CM | POA: Diagnosis not present

## 2019-09-03 DIAGNOSIS — I959 Hypotension, unspecified: Secondary | ICD-10-CM | POA: Diagnosis not present

## 2019-09-03 DIAGNOSIS — R079 Chest pain, unspecified: Secondary | ICD-10-CM | POA: Diagnosis not present

## 2019-09-03 DIAGNOSIS — R0602 Shortness of breath: Secondary | ICD-10-CM | POA: Diagnosis not present

## 2019-09-03 DIAGNOSIS — I10 Essential (primary) hypertension: Secondary | ICD-10-CM | POA: Diagnosis not present

## 2019-09-03 LAB — I-STAT BETA HCG BLOOD, ED (MC, WL, AP ONLY): I-stat hCG, quantitative: <5 m[IU]/mL (ref <5)

## 2019-09-03 LAB — CBC
HCT: 36 % (ref 36.0–46.0)
Hemoglobin: 12.6 g/dL (ref 12.0–15.0)
MCH: 28.8 pg (ref 26.0–34.0)
MCHC: 35 g/dL (ref 30.0–36.0)
MCV: 82.4 fL (ref 80.0–100.0)
Platelets: 269 10*3/uL (ref 150–400)
RBC: 4.37 MIL/uL (ref 3.87–5.11)
RDW: 12.4 % (ref 11.5–15.5)
WBC: 4.3 10*3/uL (ref 4.0–10.5)
nRBC: 0 % (ref 0.0–0.2)

## 2019-09-03 LAB — BASIC METABOLIC PANEL
Anion gap: 8 (ref 5–15)
BUN: 7 mg/dL (ref 6–20)
CO2: 23 mmol/L (ref 22–32)
Calcium: 9.1 mg/dL (ref 8.9–10.3)
Chloride: 103 mmol/L (ref 98–111)
Creatinine, Ser: 0.68 mg/dL (ref 0.44–1.00)
GFR calc Af Amer: >60 mL/min (ref >60)
GFR calc non Af Amer: >60 mL/min (ref >60)
Glucose, Bld: 123 mg/dL — ABNORMAL HIGH (ref 70–99)
Potassium: 3.9 mmol/L (ref 3.5–5.1)
Sodium: 134 mmol/L — ABNORMAL LOW (ref 135–145)

## 2019-09-03 LAB — TROPONIN I (HIGH SENSITIVITY)
Troponin I (High Sensitivity): <2 ng/L (ref <18)
Troponin I (High Sensitivity): <2 ng/L (ref <18)

## 2019-09-03 LAB — SARS CORONAVIRUS 2 BY RT PCR (HOSPITAL ORDER, PERFORMED IN ~~LOC~~ HOSPITAL LAB): SARS Coronavirus 2: NEGATIVE

## 2019-09-03 LAB — D-DIMER, QUANTITATIVE: D-Dimer, Quant: 0.92 ug/mL-FEU — ABNORMAL HIGH (ref 0.00–0.50)

## 2019-09-03 MED ORDER — IOHEXOL 350 MG/ML SOLN
55.0000 mL | Freq: Once | INTRAVENOUS | Status: AC | PRN
  Administered 2019-09-03: 55 mL via INTRAVENOUS

## 2019-09-03 MED ORDER — SODIUM CHLORIDE 0.9% FLUSH: 3.0000 mL | Freq: Once | INTRAVENOUS | Status: DC

## 2019-09-03 NOTE — ED Triage Notes (Signed)
Pt bib ems from health dept with chest pain and shob X10 days. Health dept states pt in Resp distress with 2-3 word sentences. Pt placed on simple mask at 2L [place]. Pt mother tested positive for Covid not long ago. Given 324mg  aspirin by health dept. IV place by EMS.  130/80 HR 60 RR 16 97% simple mask @8 
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
## 2019-09-03 NOTE — ED Notes (Signed)
Patient verbalizes understanding of discharge instructions. Opportunity for questioning and answers were provided. Armband removed by staff, pt discharged from ED ambulatory with daughter.

## 2019-11-19 ENCOUNTER — Encounter: Payer: Self-pay | Admitting: Nurse Practitioner

## 2019-11-19 ENCOUNTER — Other Ambulatory Visit: Payer: Self-pay

## 2019-11-19 ENCOUNTER — Ambulatory Visit: Payer: Medicaid Other | Attending: Nurse Practitioner | Admitting: Nurse Practitioner

## 2019-11-19 VITALS — Ht 65.0 in | Wt 159.0 lb

## 2019-11-19 DIAGNOSIS — U071 COVID-19: Secondary | ICD-10-CM

## 2019-11-19 DIAGNOSIS — Z7689 Persons encountering health services in other specified circumstances: Secondary | ICD-10-CM | POA: Diagnosis not present

## 2019-11-19 DIAGNOSIS — R7303 Prediabetes: Secondary | ICD-10-CM | POA: Diagnosis not present

## 2019-11-19 DIAGNOSIS — Z1231 Encounter for screening mammogram for malignant neoplasm of breast: Secondary | ICD-10-CM

## 2019-11-19 DIAGNOSIS — D649 Anemia, unspecified: Secondary | ICD-10-CM | POA: Diagnosis not present

## 2019-11-19 DIAGNOSIS — L309 Dermatitis, unspecified: Secondary | ICD-10-CM

## 2019-11-19 DIAGNOSIS — Z130 Encounter for screening for diseases of the blood and blood-forming organs and certain disorders involving the immune mechanism: Secondary | ICD-10-CM | POA: Diagnosis not present

## 2019-11-19 MED ORDER — TRIAMCINOLONE ACETONIDE 0.1 % EX CREA: 1.0000 "application " | TOPICAL_CREAM | Freq: Two times a day (BID) | CUTANEOUS | 1 refills | Status: DC

## 2019-11-19 MED ORDER — ALBUTEROL SULFATE HFA 108 (90 BASE) MCG/ACT IN AERS: 2.0000 | INHALATION_SPRAY | Freq: Four times a day (QID) | RESPIRATORY_TRACT | 1 refills | Status: DC | PRN

## 2019-11-19 NOTE — Progress Notes (Signed)
Virtual Visit via Telephone Note Due to national recommendations of social distancing due to [place] 19, telehealth visit is felt to be most appropriate for this patient at this time.  I discussed the limitations, risks, security and privacy concerns of performing an evaluation and management service by telephone and the availability of in person appointments. I also discussed with the patient that there may be a patient responsible charge related to this service. The patient expressed understanding and agreed to proceed.    I connected with Gina Odonnell on 11/19/19  at   8:50 AM EDT  EDT by telephone and verified that I am speaking with the correct person using two identifiers.   Consent I discussed the limitations, risks, security and privacy concerns of performing an evaluation and management service by telephone and the availability of in person appointments. I also discussed with the patient that there may be a patient responsible charge related to this service. The patient expressed understanding and agreed to proceed.   Location of Patient: Private Residence    Location of Provider: [place] and [employer] Office    Persons participating in Telemedicine visit: Gina Rankins FNP-BC Charlo [id number]   History of Present Illness: Telemedicine visit for: Establish care Patient has been counseled on age-appropriate routine health concerns for screening and prevention. These are reviewed and up-to-date. Referrals have been placed accordingly. Immunizations are up-to-date or declined.  Mammogram: Referred to breast center PAP SMEAR: Last 2019. S/P Hysterectomy with B/L salpingectomy 01-11-2018     She was diagnosed with COVID 11-13-2019. Currently experiencing Headaches and shortness of breath with exertion. Recommended Tylenol or XS tylenol. Will send albuterol inhaler.    Eczema She has dry rough patchy areas of skin that itch around both  ankles. She has tried OTC Hydrocortisone cream with no relief.    Past Medical History:  Diagnosis Date  . Pre-diabetes     Past Surgical History:  Procedure Laterality Date  . ABDOMINAL HYSTERECTOMY    . BILATERAL SALPINGECTOMY Bilateral 01/11/2018   Procedure: BILATERAL SALPINGECTOMY;  Surgeon: Florian Buff, MD;  Location: [facility];  Service: Gynecology;  Laterality: Bilateral;  . CHOLECYSTECTOMY    . SUPRACERVICAL ABDOMINAL HYSTERECTOMY N/A 01/11/2018   Procedure: HYSTERECTOMY SUPRACERVICAL ABDOMINAL;  Surgeon: Florian Buff, MD;  Location: [facility];  Service: Gynecology;  Laterality: N/A;    Family History  Problem Relation Age of Onset  . Cancer Other   . Colon cancer Father   . Hypertension Mother   . Diabetes Mother     Social History   Socioeconomic History  . Marital status: Divorced    Spouse name: Not on file  . Number of children: 1  . Years of education: Not on file  . Highest education level: Not on file  Occupational History  . Not on file  Tobacco Use  . Smoking status: Never Smoker  . Smokeless tobacco: Never Used  Vaping Use  . Vaping Use: Never used  Substance and Sexual Activity  . Alcohol use: No  . Drug use: No  . Sexual activity: Not Currently    Birth control/protection: Surgical    Comment: [facility]  Other Topics Concern  . Not on file  Social History Narrative  . Not on file   Social Determinants of Health   Financial Resource Strain:   . Difficulty of Paying Living Expenses: Not on file  Food Insecurity:   . Worried About Charity fundraiser in  the Last Year: Not on file  . Ran Out of Food in the Last Year: Not on file  Transportation Needs:   . Lack of Transportation (Medical): Not on file  . Lack of Transportation (Non-Medical): Not on file  Physical Activity:   . Days of Exercise per Week: Not on file  . Minutes of Exercise per Session: Not on file  Stress:   . Feeling of Stress : Not on file  Social Connections:   . Frequency of  Communication with Friends and Family: Not on file  . Frequency of Social Gatherings with Friends and Family: Not on file  . Attends Religious Services: Not on file  . Active Member of Clubs or Organizations: Not on file  . Attends Archivist Meetings: Not on file  . Marital Status: Not on file     Observations/Objective: Awake, alert and oriented x 3   Review of Systems  Constitutional: Negative for fever, malaise/fatigue and weight loss.  HENT: Negative.  Negative for nosebleeds.   Eyes: Negative.  Negative for blurred vision, double vision and photophobia.  Respiratory: Positive for cough and shortness of breath.   Cardiovascular: Negative.  Negative for chest pain, palpitations and leg swelling.  Gastrointestinal: Negative.  Negative for heartburn, nausea and vomiting.  Musculoskeletal: Negative.  Negative for myalgias.  Skin: Positive for itching and rash.  Neurological: Positive for headaches. Negative for dizziness, focal weakness and seizures.  Psychiatric/Behavioral: Negative.  Negative for suicidal ideas.    Assessment and Plan: Gina Odonnell was seen today for new patient (initial visit).  Diagnoses and all orders for this visit:  Encounter to establish care  Breast cancer screening by mammogram -     MM 3D SCREEN BREAST BILATERAL; Future  COVID-19 -     albuterol (VENTOLIN HFA) 108 (90 Base) MCG/ACT inhaler; Inhale 2 puffs into the lungs every 6 (six) hours as needed for wheezing or shortness of breath.  Eczema, unspecified type -     triamcinolone cream (KENALOG) 0.1 %; Apply 1 application topically 2 (two) times daily.  Prediabetes -     Hemoglobin A1c; Future -     CMP14+EGFR; Future -     Lipid panel; Future -     TSH; Future  Screening for deficiency anemia  Anemia, unspecified type -     CBC; Future     Follow Up Instructions Return in about 5 weeks (around 12/24/2019) for FASTING labs and Physical.     I discussed the assessment and  treatment plan with the patient. The patient was provided an opportunity to ask questions and all were answered. The patient agreed with the plan and demonstrated an understanding of the instructions.   The patient was advised to call back or seek an in-person evaluation if the symptoms worsen or if the condition fails to improve as anticipated.  I provided 17 minutes of non-face-to-face time during this encounter including median intraservice time, reviewing previous notes, labs, imaging, medications and explaining diagnosis and management.  Gildardo Pounds, FNP-BC

## 2019-12-14 ENCOUNTER — Ambulatory Visit: Payer: Medicaid Other | Attending: Nurse Practitioner

## 2019-12-14 ENCOUNTER — Other Ambulatory Visit: Payer: Self-pay

## 2019-12-14 DIAGNOSIS — D649 Anemia, unspecified: Secondary | ICD-10-CM | POA: Diagnosis not present

## 2019-12-14 DIAGNOSIS — R7303 Prediabetes: Secondary | ICD-10-CM

## 2019-12-15 LAB — LIPID PANEL
Chol/HDL Ratio: 3.2 ratio (ref 0.0–4.4)
Cholesterol, Total: 184 mg/dL (ref 100–199)
HDL: 58 mg/dL (ref >39)
LDL Chol Calc (NIH): 109 mg/dL — ABNORMAL HIGH (ref 0–99)
Triglycerides: 92 mg/dL (ref 0–149)
VLDL Cholesterol Cal: 17 mg/dL (ref 5–40)

## 2019-12-15 LAB — CBC
Hematocrit: 36 % (ref 34.0–46.6)
Hemoglobin: 12.7 g/dL (ref 11.1–15.9)
MCH: 30 pg (ref 26.6–33.0)
MCHC: 35.3 g/dL (ref 31.5–35.7)
MCV: 85 fL (ref 79–97)
Platelets: 300 10*3/uL (ref 150–450)
RBC: 4.23 x10E6/uL (ref 3.77–5.28)
RDW: 13.3 % (ref 11.7–15.4)
WBC: 5.7 10*3/uL (ref 3.4–10.8)

## 2019-12-15 LAB — HEMOGLOBIN A1C
Est. average glucose Bld gHb Est-mCnc: 100 mg/dL
Hgb A1c MFr Bld: 5.1 % (ref 4.8–5.6)

## 2019-12-15 LAB — CMP14+EGFR
ALT: 18 IU/L (ref 0–32)
AST: 15 IU/L (ref 0–40)
Albumin/Globulin Ratio: 1.6 (ref 1.2–2.2)
Albumin: 4.2 g/dL (ref 3.8–4.8)
Alkaline Phosphatase: 64 IU/L (ref 44–121)
BUN/Creatinine Ratio: 10 (ref 9–23)
BUN: 7 mg/dL (ref 6–24)
Bilirubin Total: 0.5 mg/dL (ref 0.0–1.2)
CO2: 26 mmol/L (ref 20–29)
Calcium: 9 mg/dL (ref 8.7–10.2)
Chloride: 100 mmol/L (ref 96–106)
Creatinine, Ser: 0.67 mg/dL (ref 0.57–1.00)
GFR calc Af Amer: 126 mL/min/{1.73_m2} (ref >59)
GFR calc non Af Amer: 110 mL/min/{1.73_m2} (ref >59)
Globulin, Total: 2.6 g/dL (ref 1.5–4.5)
Glucose: 140 mg/dL — ABNORMAL HIGH (ref 65–99)
Potassium: 3.6 mmol/L (ref 3.5–5.2)
Sodium: 139 mmol/L (ref 134–144)
Total Protein: 6.8 g/dL (ref 6.0–8.5)

## 2019-12-15 LAB — TSH: TSH: 0.778 u[IU]/mL (ref 0.450–4.500)

## 2020-01-02 ENCOUNTER — Encounter: Payer: Self-pay | Admitting: Nurse Practitioner

## 2020-01-02 ENCOUNTER — Ambulatory Visit: Payer: Medicaid Other | Attending: Nurse Practitioner | Admitting: Nurse Practitioner

## 2020-01-02 ENCOUNTER — Other Ambulatory Visit (HOSPITAL_COMMUNITY)
Admission: RE | Admit: 2020-01-02 | Discharge: 2020-01-02 | Disposition: A | Discharge status: Home / Self Care | Payer: Medicaid Other | Source: Ambulatory Visit | Attending: Nurse Practitioner | Admitting: Nurse Practitioner

## 2020-01-02 ENCOUNTER — Other Ambulatory Visit: Payer: Self-pay

## 2020-01-02 VITALS — BP 128/83 | HR 65 | Temp 98.1°F | Ht 63.74 in | Wt 161.8 lb

## 2020-01-02 DIAGNOSIS — Z0000 Encounter for general adult medical examination without abnormal findings: Secondary | ICD-10-CM | POA: Diagnosis not present

## 2020-01-02 DIAGNOSIS — N760 Acute vaginitis: Secondary | ICD-10-CM | POA: Diagnosis not present

## 2020-01-02 DIAGNOSIS — L309 Dermatitis, unspecified: Secondary | ICD-10-CM | POA: Diagnosis not present

## 2020-01-02 MED ORDER — TRIAMCINOLONE ACETONIDE 0.1 % EX CREA: 1.0000 "application " | TOPICAL_CREAM | Freq: Two times a day (BID) | CUTANEOUS | 1 refills | Status: DC

## 2020-01-02 NOTE — Progress Notes (Signed)
Last pap 2019 Cream has been helping w/skin

## 2020-01-02 NOTE — Addendum Note (Signed)
Addended byMariane Baumgarten on: 01/02/2020 04:05 PM   Modules accepted: Orders

## 2020-01-02 NOTE — Addendum Note (Signed)
Addended byMariane Baumgarten on: 01/02/2020 04:22 PM   Modules accepted: Orders

## 2020-01-02 NOTE — Progress Notes (Signed)
Assessment & Plan:  Gina Odonnell was seen today for annual exam.  Diagnoses and all orders for this visit:  Encounter for annual physical exam  Eczema, unspecified type -     triamcinolone (KENALOG) 0.1 %; Apply 1 application topically 2 (two) times daily.  Acute vaginitis -     Cervicovaginal ancillary only    Patient has been counseled on age-appropriate routine health concerns for screening and prevention. These are reviewed and up-to-date. Referrals have been placed accordingly. Immunizations are up-to-date or declined.    Subjective:   Chief Complaint  Patient presents with  . Annual Exam   HPI Gina Odonnell 39 y.o. female presents to office today for annual physical   Review of Systems  Constitutional: Negative for fever, malaise/fatigue and weight loss.  HENT: Negative.  Negative for nosebleeds.   Eyes: Negative.  Negative for blurred vision, double vision and photophobia.  Respiratory: Negative.  Negative for cough and shortness of breath.   Cardiovascular: Negative.  Negative for chest pain, palpitations and leg swelling.  Gastrointestinal: Negative.  Negative for heartburn, nausea and vomiting.  Genitourinary: Negative.        Vaginal odor and irritation  Musculoskeletal: Negative.  Negative for myalgias.  Skin: Negative.   Neurological: Negative.  Negative for dizziness, focal weakness, seizures and headaches.  Endo/Heme/Allergies: Negative.   Psychiatric/Behavioral: Positive for memory loss. Negative for suicidal ideas.    Past Medical History:  Diagnosis Date  . Pre-diabetes     Past Surgical History:  Procedure Laterality Date  . ABDOMINAL HYSTERECTOMY    . BILATERAL SALPINGECTOMY Bilateral 01/11/2018   Procedure: BILATERAL SALPINGECTOMY;  Surgeon: Florian Buff, MD;  Location: [facility];  Service: Gynecology;  Laterality: Bilateral;  . CHOLECYSTECTOMY    . SUPRACERVICAL ABDOMINAL HYSTERECTOMY N/A 01/11/2018   Procedure: HYSTERECTOMY SUPRACERVICAL  ABDOMINAL;  Surgeon: Florian Buff, MD;  Location: [facility];  Service: Gynecology;  Laterality: N/A;    Family History  Problem Relation Age of Onset  . Cancer Other   . Colon cancer Father   . Hypertension Mother   . Diabetes Mother     Social History Reviewed with no changes to be made today.   Outpatient Medications Prior to Visit  Medication Sig Dispense Refill  . albuterol (VENTOLIN HFA) 108 (90 Base) MCG/ACT inhaler Inhale 2 puffs into the lungs every 6 (six) hours as needed for wheezing or shortness of breath. 18 g 1  . triamcinolone cream (KENALOG) 0.1 % Apply 1 application topically 2 (two) times daily. 60 g 1   No facility-administered medications prior to visit.    No Known Allergies     Objective:    BP 128/83 (BP Location: Left Arm, Patient Position: Sitting)   Pulse 65   Temp 98.1 F (36.7 C)   Ht 5' 3.74" (1.619 m)   Wt 161 lb 12.8 oz (73.4 kg)   LMP 01/06/2018   SpO2 95%   BMI 28.00 kg/m  Wt Readings from Last 3 Encounters:  01/02/20 161 lb 12.8 oz (73.4 kg)  11/19/19 159 lb (72.1 kg)  05/14/19 148 lb (67.1 kg)    Physical Exam Constitutional:      Appearance: She is well-developed.  HENT:     Head: Normocephalic and atraumatic.     Right Ear: External ear normal.     Left Ear: External ear normal.     Nose: Nose normal.     Mouth/Throat:     Pharynx: No oropharyngeal exudate.  Eyes:  General: No scleral icterus.       Right eye: No discharge.     Conjunctiva/sclera: Conjunctivae normal.     Pupils: Pupils are equal, round, and reactive to light.  Neck:     Thyroid: No thyromegaly.     Trachea: No tracheal deviation.  Cardiovascular:     Rate and Rhythm: Normal rate and regular rhythm.     Heart sounds: Normal heart sounds. No murmur heard.  No friction rub.  Pulmonary:     Effort: Pulmonary effort is normal. No accessory muscle usage or respiratory distress.     Breath sounds: Normal breath sounds. No decreased breath sounds,  wheezing, rhonchi or rales.  Chest:     Chest wall: No tenderness.     Breasts: Breasts are symmetrical.        Right: No inverted nipple, mass, nipple discharge, skin change or tenderness.        Left: No inverted nipple, mass, nipple discharge, skin change or tenderness.  Abdominal:     General: Bowel sounds are normal. There is no distension.     Palpations: Abdomen is soft. There is no mass.     Tenderness: There is no abdominal tenderness. There is no guarding or rebound.  Musculoskeletal:        General: No tenderness or deformity. Normal range of motion.     Cervical back: Normal range of motion and neck supple.  Lymphadenopathy:     Cervical: No cervical adenopathy.  Skin:    General: Skin is warm and dry.     Findings: Rash present. No erythema. Rash is macular.          Comments: Macular dry patchy rash on right foot  Neurological:     Mental Status: She is alert and oriented to person, place, and time.     Cranial Nerves: No cranial nerve deficit.     Coordination: Coordination normal.     Deep Tendon Reflexes: Reflexes are normal and symmetric.  Psychiatric:        Speech: Speech normal.        Behavior: Behavior normal.        Thought Content: Thought content normal.        Judgment: Judgment normal.          Patient has been counseled extensively about nutrition and exercise as well as the importance of adherence with medications and regular follow-up. The patient was given clear instructions to go to ER or return to medical center if symptoms don't improve, worsen or new problems develop. The patient verbalized understanding.   Follow-up: Return if symptoms worsen or fail to improve.   Gildardo Pounds, FNP-BC [facility] [place], [place]   01/02/2020, 3:54 PM

## 2020-01-04 LAB — CERVICOVAGINAL ANCILLARY ONLY
Bacterial Vaginitis (gardnerella): NEGATIVE
Candida Glabrata: NEGATIVE
Candida Vaginitis: NEGATIVE
Chlamydia: NEGATIVE
Comment: NEGATIVE
Comment: NEGATIVE
Comment: NEGATIVE
Comment: NEGATIVE
Comment: NEGATIVE
Comment: NORMAL
Neisseria Gonorrhea: NEGATIVE
Trichomonas: NEGATIVE

## 2020-02-09 HISTORY — PX: OTHER SURGICAL HISTORY: SHX169

## 2020-06-17 DIAGNOSIS — M79644 Pain in right finger(s): Secondary | ICD-10-CM | POA: Diagnosis not present

## 2020-06-17 DIAGNOSIS — R200 Anesthesia of skin: Secondary | ICD-10-CM | POA: Diagnosis not present

## 2020-07-22 DIAGNOSIS — M65331 Trigger finger, right middle finger: Secondary | ICD-10-CM | POA: Diagnosis not present

## 2020-07-22 DIAGNOSIS — R200 Anesthesia of skin: Secondary | ICD-10-CM | POA: Diagnosis not present

## 2020-07-30 DIAGNOSIS — G5601 Carpal tunnel syndrome, right upper limb: Secondary | ICD-10-CM | POA: Diagnosis not present

## 2020-08-28 DIAGNOSIS — M65331 Trigger finger, right middle finger: Secondary | ICD-10-CM | POA: Diagnosis not present

## 2020-08-28 DIAGNOSIS — G5601 Carpal tunnel syndrome, right upper limb: Secondary | ICD-10-CM | POA: Diagnosis not present

## 2020-09-12 DIAGNOSIS — M65331 Trigger finger, right middle finger: Secondary | ICD-10-CM | POA: Diagnosis not present

## 2020-09-12 DIAGNOSIS — G5601 Carpal tunnel syndrome, right upper limb: Secondary | ICD-10-CM | POA: Diagnosis not present

## 2020-12-18 DIAGNOSIS — G5601 Carpal tunnel syndrome, right upper limb: Secondary | ICD-10-CM | POA: Diagnosis not present

## 2020-12-18 DIAGNOSIS — M65331 Trigger finger, right middle finger: Secondary | ICD-10-CM | POA: Diagnosis not present

## 2021-01-28 ENCOUNTER — Ambulatory Visit (INDEPENDENT_AMBULATORY_CARE_PROVIDER_SITE_OTHER): Payer: Medicaid Other | Admitting: Adult Health

## 2021-01-28 ENCOUNTER — Other Ambulatory Visit: Payer: Self-pay

## 2021-01-28 ENCOUNTER — Encounter: Payer: Self-pay | Admitting: Adult Health

## 2021-01-28 ENCOUNTER — Other Ambulatory Visit (HOSPITAL_COMMUNITY)
Admission: RE | Admit: 2021-01-28 | Discharge: 2021-01-28 | Disposition: A | Discharge status: Home / Self Care | Payer: Medicaid Other | Source: Ambulatory Visit | Attending: Women's Health | Admitting: Women's Health

## 2021-01-28 ENCOUNTER — Other Ambulatory Visit: Payer: Medicaid Other | Admitting: Women's Health

## 2021-01-28 VITALS — BP 117/81 | HR 74 | Ht 65.0 in | Wt 180.4 lb

## 2021-01-28 DIAGNOSIS — Z90711 Acquired absence of uterus with remaining cervical stump: Secondary | ICD-10-CM

## 2021-01-28 DIAGNOSIS — Z0000 Encounter for general adult medical examination without abnormal findings: Secondary | ICD-10-CM

## 2021-01-28 DIAGNOSIS — Z01419 Encounter for gynecological examination (general) (routine) without abnormal findings: Secondary | ICD-10-CM

## 2021-01-28 DIAGNOSIS — Z1211 Encounter for screening for malignant neoplasm of colon: Secondary | ICD-10-CM | POA: Insufficient documentation

## 2021-01-28 DIAGNOSIS — N898 Other specified noninflammatory disorders of vagina: Secondary | ICD-10-CM

## 2021-01-28 DIAGNOSIS — Z1231 Encounter for screening mammogram for malignant neoplasm of breast: Secondary | ICD-10-CM

## 2021-01-28 DIAGNOSIS — F419 Anxiety disorder, unspecified: Secondary | ICD-10-CM | POA: Diagnosis not present

## 2021-01-28 DIAGNOSIS — Z800 Family history of malignant neoplasm of digestive organs: Secondary | ICD-10-CM

## 2021-01-28 DIAGNOSIS — F32A Depression, unspecified: Secondary | ICD-10-CM | POA: Diagnosis not present

## 2021-01-28 DIAGNOSIS — Z1212 Encounter for screening for malignant neoplasm of rectum: Secondary | ICD-10-CM | POA: Diagnosis not present

## 2021-01-28 LAB — HEMOCCULT GUIAC POC 1CARD (OFFICE): Fecal Occult Blood, POC: NEGATIVE

## 2021-01-28 MED ORDER — METRONIDAZOLE 0.75 % VA GEL: VAGINAL | 1 refills | Status: DC

## 2021-01-28 NOTE — Progress Notes (Signed)
Patient ID: Gina Odonnell, female   DOB: Aug 13, 1980, 40 y.o.   MRN: [id number] History of Present Illness:  Gina Odonnell is a 40 year old biracial female, divorced, sp [facility] in for well woman gyn exam and pap. She notices vaginal odor, esp with sex.   Current Medications, Allergies, Past Medical History, Past Surgical History, Family History and Social History were reviewed in [employer] record.     Review of Systems:  Patient denies any headaches, hearing loss, fatigue, blurred vision, shortness of breath, chest pain, abdominal pain, problems with bowel movements, urination, or intercourse. No joint pain or mood swings.  +vaginal odor   Physical Exam:BP 117/81 (BP Location: Right Arm, Patient Position: Sitting, Cuff Size: Normal)    Pulse 74    Ht 5\' 5"  (1.651 m)    Wt 180 lb 6.4 oz (81.8 kg)    LMP 01/06/2018    BMI 30.02 kg/m   General:  Well developed, well nourished, no acute distress Skin:  Warm and dry Neck:  Midline trachea, normal thyroid, good ROM, no lymphadenopathy Lungs; Clear to auscultation bilaterally Breast:  No dominant palpable mass, retraction, or nipple discharge Cardiovascular: Regular rate and rhythm Abdomen:  Soft, non tender, no hepatosplenomegaly Pelvic:  External genitalia is normal in appearance, no lesions.  The vagina is normal in appearance. Urethra has no lesions or masses. The cervix is bulbous. Pap with GC/CHL and HR HPV genotyping performed.  Uterus is absent.  No adnexal masses or tenderness noted.Bladder is non tender, no masses felt. Rectal: Good sphincter tone, no polyps, or hemorrhoids felt.  Hemoccult negative. Extremities/musculoskeletal:  No swelling or varicosities noted, no clubbing or cyanosis Psych:  No mood changes, alert and cooperative,seems happy AA is 2 Fall risk is low Depression screen [facility] 2/9 01/28/2021 01/02/2020 11/19/2019  Decreased Interest 2 3 0  Down, Depressed, Hopeless 1 3 0  PHQ - 2 Score 3 6 0  Altered  sleeping 1 2 -  Tired, decreased energy 1 3 -  Change in appetite 1 2 -  Feeling bad or failure about yourself  0 0 -  Trouble concentrating 1 0 -  Moving slowly or fidgety/restless 1 0 -  Suicidal thoughts 0 0 -  PHQ-9 Score 8 13 -  Difficult doing work/chores - - -      GAD 7 : Generalized Anxiety Score 01/28/2021  Nervous, Anxious, on Edge 1  Control/stop worrying 0  Worry too much - different things 1  Trouble relaxing 1  Restless 1  Easily annoyed or irritable 1  Afraid - awful might happen 1  Total GAD 7 Score 6    Upstream - 01/28/21 1134       Pregnancy Intention Screening   Does the patient want to become pregnant in the next year? N/A    Does the patient's partner want to become pregnant in the next year? N/A    Would the patient like to discuss contraceptive options today? N/A      Contraception Wrap Up   Current Method Female Sterilization    End Method Female Sterilization    Contraception Counseling Provided No             .Examination chaperoned by Levy Pupa LPN  Impression and plan: 1. Routine general medical examination at a health care facility Pap sent - Cytology - PAP( [place])  2. Encounter for gynecological examination with Papanicolaou smear of cervix Pap sent  Physical in 1 year Pap in 3  if normal  3. S/P abdominal supracervical subtotal hysterectomy   4. Encounter for screening fecal occult blood testing  - POCT occult blood stool  5. Vaginal odor Will rx Metrogel Meds ordered this encounter  Medications   metroNIDAZOLE (METROGEL) 0.75 % vaginal gel    Sig: Use 1 applicator in vagina daily if notices odor.    Dispense:  70 g    Refill:  1    Order Specific Question:   Supervising Provider    Answer:   EURE, LUTHER H [[id number]]     6. Screening for colorectal cancer Referred to [facility] Dr Abbey Chatters for colonoscopy - Ambulatory referral to Gastroenterology  7. Family history of colon cancer in father Referred to Dr Abbey Chatters for  colonoscopy  - Ambulatory referral to Gastroenterology  8. Screening mammogram for breast cancer Mammogram scheduled for her at [facility] 02/04/21 at 2:45 pm - MM 3D SCREEN BREAST BILATERAL; Future  9. Anxiety and depression She declines meds

## 2021-02-04 ENCOUNTER — Other Ambulatory Visit: Payer: Self-pay | Admitting: Adult Health

## 2021-02-04 ENCOUNTER — Ambulatory Visit (HOSPITAL_COMMUNITY)
Admission: RE | Admit: 2021-02-04 | Discharge: 2021-02-04 | Disposition: A | Discharge status: Home / Self Care | Payer: Medicaid Other | Source: Ambulatory Visit | Attending: Adult Health | Admitting: Adult Health

## 2021-02-04 ENCOUNTER — Other Ambulatory Visit: Payer: Self-pay

## 2021-02-04 DIAGNOSIS — Z1231 Encounter for screening mammogram for malignant neoplasm of breast: Secondary | ICD-10-CM | POA: Diagnosis not present

## 2021-02-04 LAB — CYTOLOGY - PAP
Chlamydia: NEGATIVE
Comment: NEGATIVE
Comment: NEGATIVE
Comment: NORMAL
Diagnosis: NEGATIVE
High risk HPV: NEGATIVE
Neisseria Gonorrhea: NEGATIVE

## 2021-02-06 ENCOUNTER — Other Ambulatory Visit (HOSPITAL_COMMUNITY): Payer: Self-pay | Admitting: Adult Health

## 2021-02-06 DIAGNOSIS — R928 Other abnormal and inconclusive findings on diagnostic imaging of breast: Secondary | ICD-10-CM

## 2021-02-16 ENCOUNTER — Encounter: Payer: Self-pay | Admitting: Internal Medicine

## 2021-02-24 ENCOUNTER — Other Ambulatory Visit: Payer: Self-pay

## 2021-02-24 ENCOUNTER — Ambulatory Visit (HOSPITAL_COMMUNITY)
Admission: RE | Admit: 2021-02-24 | Discharge: 2021-02-24 | Disposition: A | Discharge status: Home / Self Care | Payer: Medicaid Other | Source: Ambulatory Visit | Attending: Adult Health | Admitting: Adult Health

## 2021-02-24 ENCOUNTER — Other Ambulatory Visit (HOSPITAL_COMMUNITY): Payer: Self-pay | Admitting: Adult Health

## 2021-02-24 DIAGNOSIS — R928 Other abnormal and inconclusive findings on diagnostic imaging of breast: Secondary | ICD-10-CM

## 2021-02-24 DIAGNOSIS — N6002 Solitary cyst of left breast: Secondary | ICD-10-CM | POA: Diagnosis not present

## 2021-02-24 DIAGNOSIS — R922 Inconclusive mammogram: Secondary | ICD-10-CM | POA: Diagnosis not present

## 2021-03-03 ENCOUNTER — Other Ambulatory Visit (HOSPITAL_COMMUNITY): Payer: Medicaid Other

## 2021-03-03 ENCOUNTER — Encounter (HOSPITAL_COMMUNITY): Payer: Medicaid Other

## 2021-04-08 ENCOUNTER — Ambulatory Visit (INDEPENDENT_AMBULATORY_CARE_PROVIDER_SITE_OTHER): Payer: Self-pay | Admitting: *Deleted

## 2021-04-08 ENCOUNTER — Other Ambulatory Visit: Payer: Self-pay

## 2021-04-08 VITALS — Ht 64.0 in | Wt 155.0 lb

## 2021-04-08 DIAGNOSIS — Z1210 Encounter for screening for malignant neoplasm of intestinal tract, unspecified: Secondary | ICD-10-CM

## 2021-04-08 NOTE — Progress Notes (Signed)
Gastroenterology Pre-Procedure Review  Request Date: 04/08/2021 Requesting Physician: Derrek Monaco, NP @ [facility], no previous TCS, family history of colon cancer (father)  PATIENT REVIEW QUESTIONS: The patient responded to the following health history questions as indicated:    1. Diabetes Melitis: no 2. Joint replacements in the past 12 months: no 3. Major health problems in the past 3 months: no 4. Has an artificial valve or MVP: no 5. Has a defibrillator: no 6. Has been advised in past to take antibiotics in advance of a procedure like teeth cleaning: no 7. Family history of colon cancer: yes, father: age 62  8. Alcohol Use: no 9. Illicit drug Use: no 10. History of sleep apnea: no  11. History of coronary artery or other vascular stents placed within the last 12 months: no 12. History of any prior anesthesia complications: no 13. Body mass index is 26.61 kg/m.    MEDICATIONS & ALLERGIES:    Patient reports the following regarding taking any blood thinners:   Plavix? no Aspirin? no Coumadin? no Brilinta? no Xarelto? no Eliquis? no Pradaxa? no Savaysa? no Effient? no  Patient confirms/reports the following medications:  Current Outpatient Medications  Medication Sig Dispense Refill   albuterol (VENTOLIN HFA) 108 (90 Base) MCG/ACT inhaler Inhale 2 puffs into the lungs every 6 (six) hours as needed for wheezing or shortness of breath. 18 g 1   No current facility-administered medications for this visit.    Patient confirms/reports the following allergies:  No Known Allergies  No orders of the defined types were placed in this encounter.   AUTHORIZATION INFORMATION Primary Insurance: [place] [employer] [place],  [place] #: [id number],  Group #: [id number] Pre-Cert / Auth required:  Pre-Cert / Auth #:   SCHEDULE INFORMATION: Procedure has been scheduled as follows:  Date: , Time:   Location: [facility] with Dr. Abbey Chatters  This Gastroenterology Pre-Precedure Review  Form is being routed to the following provider(s): Roseanne Kaufman, NP

## 2021-04-15 NOTE — Progress Notes (Signed)
Appropriate. ASA 2.  

## 2021-04-15 NOTE — Progress Notes (Signed)
Lmom for pt to call us back. 

## 2021-04-20 ENCOUNTER — Encounter: Payer: Self-pay | Admitting: *Deleted

## 2021-04-20 NOTE — Progress Notes (Signed)
Tried to call pt but voice mail is full.  Mailed letter to pt. ?

## 2021-04-21 ENCOUNTER — Encounter: Payer: Self-pay | Admitting: *Deleted

## 2021-04-21 MED ORDER — NA SULFATE-K SULFATE-MG SULF 17.5-3.13-1.6 GM/177ML PO SOLN: 1.0000 | Freq: Once | ORAL | 0 refills | Status: AC

## 2021-04-21 NOTE — Progress Notes (Signed)
Spoke to pt.  Scheduled procedure for 05/05/2021 at 9:00, arrival at 7:30 at [facility].  Reviewed prep instructions with pt.  Pt made aware that I sent RX to her pharmacy.  Pt aware that I will email and mail her prep instructions to her.  Confirmed email and mailing address.   ?

## 2021-04-21 NOTE — Addendum Note (Signed)
Addended by: Metro Kung on: 04/21/2021 03:27 PM ? ? Modules accepted: Orders ? ?

## 2021-04-23 NOTE — Progress Notes (Addendum)
Spoke to [employer] at [place].  There is no age criteria for colonoscopies. No pre-cert required.  REF#:  [id number] ?

## 2021-05-05 ENCOUNTER — Ambulatory Visit (HOSPITAL_COMMUNITY): Payer: Medicaid Other | Admitting: Certified Registered Nurse Anesthetist

## 2021-05-05 ENCOUNTER — Other Ambulatory Visit: Payer: Self-pay

## 2021-05-05 ENCOUNTER — Encounter (HOSPITAL_COMMUNITY): Payer: Self-pay

## 2021-05-05 ENCOUNTER — Ambulatory Visit (HOSPITAL_COMMUNITY)
Admission: RE | Admit: 2021-05-05 | Discharge: 2021-05-05 | Disposition: A | Discharge status: Home / Self Care | Payer: Medicaid Other | Attending: Internal Medicine | Admitting: Internal Medicine

## 2021-05-05 ENCOUNTER — Ambulatory Visit (HOSPITAL_BASED_OUTPATIENT_CLINIC_OR_DEPARTMENT_OTHER): Payer: Medicaid Other | Admitting: Certified Registered Nurse Anesthetist

## 2021-05-05 ENCOUNTER — Encounter (HOSPITAL_COMMUNITY)
Admission: RE | Disposition: A | Discharge status: Home / Self Care | Payer: Self-pay | Source: Home / Self Care | Attending: Internal Medicine

## 2021-05-05 DIAGNOSIS — K648 Other hemorrhoids: Secondary | ICD-10-CM

## 2021-05-05 DIAGNOSIS — F32A Depression, unspecified: Secondary | ICD-10-CM | POA: Insufficient documentation

## 2021-05-05 DIAGNOSIS — Z1211 Encounter for screening for malignant neoplasm of colon: Secondary | ICD-10-CM | POA: Diagnosis not present

## 2021-05-05 DIAGNOSIS — Z800 Family history of malignant neoplasm of digestive organs: Secondary | ICD-10-CM

## 2021-05-05 DIAGNOSIS — F418 Other specified anxiety disorders: Secondary | ICD-10-CM | POA: Diagnosis not present

## 2021-05-05 DIAGNOSIS — F419 Anxiety disorder, unspecified: Secondary | ICD-10-CM | POA: Diagnosis not present

## 2021-05-05 HISTORY — PX: COLONOSCOPY WITH PROPOFOL: SHX5780

## 2021-05-05 SURGERY — COLONOSCOPY WITH PROPOFOL
Anesthesia: General

## 2021-05-05 MED ORDER — LACTATED RINGERS IV SOLN: INTRAVENOUS | Status: DC | PRN

## 2021-05-05 MED ORDER — LIDOCAINE HCL (CARDIAC) PF 100 MG/5ML IV SOSY
PREFILLED_SYRINGE | INTRAVENOUS | Status: DC | PRN
  Administered 2021-05-05: 50 mg via INTRAVENOUS

## 2021-05-05 MED ORDER — PROPOFOL 500 MG/50ML IV EMUL
INTRAVENOUS | Status: DC | PRN
  Administered 2021-05-05: 150 ug/kg/min via INTRAVENOUS

## 2021-05-05 MED ORDER — PROPOFOL 10 MG/ML IV BOLUS
INTRAVENOUS | Status: DC | PRN
Start: 2021-05-05 — End: 2021-05-05
  Administered 2021-05-05: 100 mg via INTRAVENOUS

## 2021-05-05 MED ORDER — STERILE WATER FOR IRRIGATION IR SOLN
Status: DC | PRN
  Administered 2021-05-05: 120 mL

## 2021-05-05 NOTE — Discharge Instructions (Addendum)
?  Colonoscopy ?Discharge Instructions ? ?Read the instructions outlined below and refer to this sheet in the next few weeks. These discharge instructions provide you with general information on caring for yourself after you leave the hospital. Your doctor may also give you specific instructions. While your treatment has been planned according to the most current medical practices available, unavoidable complications occasionally occur.  ? ?ACTIVITY ?You may resume your regular activity, but move at a slower pace for the next 24 hours.  ?Take frequent rest periods for the next 24 hours.  ?Walking will help get rid of the air and reduce the bloated feeling in your belly (abdomen).  ?No driving for 24 hours (because of the medicine (anesthesia) used during the test).   ?Do not sign any important legal documents or operate any machinery for 24 hours (because of the anesthesia used during the test).  ?NUTRITION ?Drink plenty of fluids.  ?You may resume your normal diet as instructed by your doctor.  ?Begin with a light meal and progress to your normal diet. Heavy or fried foods are harder to digest and may make you feel sick to your stomach (nauseated).  ?Avoid alcoholic beverages for 24 hours or as instructed.  ?MEDICATIONS ?You may resume your normal medications unless your doctor tells you otherwise.  ?WHAT YOU CAN EXPECT TODAY ?Some feelings of bloating in the abdomen.  ?Passage of more gas than usual.  ?Spotting of blood in your stool or on the toilet paper.  ?IF YOU HAD POLYPS REMOVED DURING THE COLONOSCOPY: ?No aspirin products for 7 days or as instructed.  ?No alcohol for 7 days or as instructed.  ?Eat a soft diet for the next 24 hours.  ?FINDING OUT THE RESULTS OF YOUR TEST ?Not all test results are available during your visit. If your test results are not back during the visit, make an appointment with your caregiver to find out the results. Do not assume everything is normal if you have not heard from your  caregiver or the medical facility. It is important for you to follow up on all of your test results.  ?SEEK IMMEDIATE MEDICAL ATTENTION IF: ?You have more than a spotting of blood in your stool.  ?Your belly is swollen (abdominal distention).  ?You are nauseated or vomiting.  ?You have a temperature over 101.  ?You have abdominal pain or discomfort that is severe or gets worse throughout the day.  ? ?Your colonoscopy was relatively unremarkable.  I did not find any polyps or evidence of colon cancer.  I recommend repeating colonoscopy in 5 years for colon cancer screening purposes given your family history.  Otherwise, Follow-up with GI as needed. ? ? ?I hope you have a great rest of your week! ? ?Elon Alas. Abbey Chatters, D.O. ?Gastroenterology and Hepatology ?[facility] ? ?

## 2021-05-05 NOTE — OR Nursing (Signed)
Gina Odonnell had a procedure at [facility] today on 05/05/21 and can return to work on 05/06/21 @ 10am.  ? ?Thank you ?

## 2021-05-05 NOTE — Anesthesia Preprocedure Evaluation (Signed)
Anesthesia Evaluation  ?Patient identified by MRN, date of birth, ID band ?Patient awake ? ? ? ?Reviewed: ?Allergy & Precautions, H&P , NPO status , Patient's Chart, lab work & pertinent test results, reviewed documented beta blocker date and time  ? ?Airway ?Mallampati: II ? ?TM Distance: >3 FB ?Neck ROM: full ? ? ? Dental ?no notable dental hx. ? ?  ?Pulmonary ?neg pulmonary ROS,  ?  ?Pulmonary exam normal ?breath sounds clear to auscultation ? ? ? ? ? ? Cardiovascular ?Exercise Tolerance: Good ?negative cardio ROS ? ? ?Rhythm:regular Rate:Normal ? ? ?  ?Neuro/Psych ?PSYCHIATRIC DISORDERS Anxiety Depression negative neurological ROS ?   ? GI/Hepatic ?negative GI ROS, Neg liver ROS,   ?Endo/Other  ?negative endocrine ROS ? Renal/GU ?negative Renal ROS  ?negative genitourinary ?  ?Musculoskeletal ? ? Abdominal ?  ?Peds ? Hematology ?negative hematology ROS ?(+)   ?Anesthesia Other Findings ? ? Reproductive/Obstetrics ?negative OB ROS ? ?  ? ? ? ? ? ? ? ? ? ? ? ? ? ?  ?  ? ? ? ? ? ? ? ? ?Anesthesia Physical ?Anesthesia Plan ? ?ASA: 2 ? ?Anesthesia Plan: General  ? ?Post-op Pain Management:   ? ?Induction:  ? ?PONV Risk Score and Plan: Propofol infusion ? ?Airway Management Planned:  ? ?Additional Equipment:  ? ?Intra-op Plan:  ? ?Post-operative Plan:  ? ?Informed Consent: I have reviewed the patients History and Physical, chart, labs and discussed the procedure including the risks, benefits and alternatives for the proposed anesthesia with the patient or authorized representative who has indicated his/her understanding and acceptance.  ? ? ? ?Dental Advisory Given ? ?Plan Discussed with: CRNA ? ?Anesthesia Plan Comments:   ? ? ? ? ? ? ?Anesthesia Quick Evaluation ? ?

## 2021-05-05 NOTE — Op Note (Signed)
[facility] ?Patient Name: Gina Odonnell ?Procedure Date: 05/05/2021 8:05 AM ?MRN: [id number] ?Date of Birth: Jun 20, 1980 ?Attending MD: Elon Alas. Abbey Chatters , DO ?CSN: [id number] ?Age: 41 ?Admit Type: Outpatient ?Procedure:                Colonoscopy ?Indications:              Colon cancer screening in patient at increased  ?                          risk: Colorectal cancer in father ?Providers:                Elon Alas. Abbey Chatters, DO, Caprice Kluver ?Referring MD:              ?Medicines:                See the Anesthesia note for documentation of the  ?                          administered medications ?Complications:            No immediate complications. ?Estimated Blood Loss:     Estimated blood loss: none. ?Procedure:                Pre-Anesthesia Assessment: ?                          - The anesthesia plan was to use monitored  ?                          anesthesia care (MAC). ?                          After obtaining informed consent, the colonoscope  ?                          was passed under direct vision. Throughout the  ?                          procedure, the patient's blood pressure, pulse, and  ?                          oxygen saturations were monitored continuously. The  ?                          PCF-HQ190L ([id number]) scope was introduced through  ?                          the anus and advanced to the the cecum, identified  ?                          by appendiceal orifice and ileocecal valve. The  ?                          colonoscopy was performed without difficulty. The  ?                          patient tolerated the procedure  well. The quality  ?                          of the bowel preparation was evaluated using the  ?                          BBPS [facility] Bowel Preparation Scale) with scores  ?                          of: Right Colon = 3, Transverse Colon = 3 and Left  ?                          Colon = 3 (entire mucosa seen well with no residual  ?                          staining,  small fragments of stool or opaque  ?                          liquid). The total BBPS score equals 9. ?Scope In: 8:35:03 AM ?Scope Out: 8:45:09 AM ?Scope Withdrawal Time: 0 hours 7 minutes 25 seconds  ?Total Procedure Duration: 0 hours 10 minutes 6 seconds  ?Findings: ?     The perianal and digital rectal examinations were normal. ?     Non-bleeding internal hemorrhoids were found during endoscopy. ?     The exam was otherwise without abnormality. ?Impression:               - Non-bleeding internal hemorrhoids. ?                          - The examination was otherwise normal. ?                          - No specimens collected. ?Moderate Sedation: ?     Per Anesthesia Care ?Recommendation:           - Patient has a contact number available for  ?                          emergencies. The signs and symptoms of potential  ?                          delayed complications were discussed with the  ?                          patient. Return to normal activities tomorrow.  ?                          Written discharge instructions were provided to the  ?                          patient. ?                          - Resume previous diet. ?                          -  Continue present medications. ?                          - Repeat colonoscopy in 5 years for high risk  ?                          screening purposes. ?                          - Return to GI clinic PRN. ?Procedure Code(s):        --- Professional --- ?                          G0105, Colorectal cancer screening; colonoscopy on  ?                          individual at high risk ?Diagnosis Code(s):        --- Professional --- ?                          Z80.0, Family history of malignant neoplasm of  ?                          digestive organs ?                          K64.8, Other hemorrhoids ?CPT copyright 2019 American Medical Association. All rights reserved. ?The codes documented in this report are preliminary and upon coder review may  ?be revised to  meet current compliance requirements. ?Elon Alas. Abbey Chatters, DO ?Elon Alas. Abbey Chatters, DO ?05/05/2021 8:46:45 AM ?This report has been signed electronically. ?Number of Addenda: 0 ?

## 2021-05-05 NOTE — Transfer of Care (Signed)
Immediate Anesthesia Transfer of Care Note ? ?Patient: Gina Odonnell ? ?Procedure(s) Performed: COLONOSCOPY WITH PROPOFOL ? ?Patient Location: Endoscopy Unit ? ?Anesthesia Type:General ? ?Level of Consciousness: drowsy ? ?Airway & Oxygen Therapy: Patient Spontanous Breathing ? ?Post-op Assessment: Report given to RN and Post -op Vital signs reviewed and stable ? ?Post vital signs: Reviewed and stable ? ?Last Vitals:  ?Vitals Value Taken Time  ?BP    ?Temp    ?Pulse 69   ?Resp    ?SpO2 95%   ? ? ?Last Pain:  ?Vitals:  ? 05/05/21 0829  ?TempSrc:   ?PainSc: 0-No pain  ?   ? ?Patients Stated Pain Goal: 9 (05/05/21 0748) ? ?Complications: No notable events documented. ?

## 2021-05-05 NOTE — Anesthesia Postprocedure Evaluation (Signed)
Anesthesia Post Note ? ?Patient: Avital Dancy ? ?Procedure(s) Performed: COLONOSCOPY WITH PROPOFOL ? ?Patient location during evaluation: Phase II ?Anesthesia Type: General ?Level of consciousness: awake ?Pain management: pain level controlled ?Vital Signs Assessment: post-procedure vital signs reviewed and stable ?Respiratory status: spontaneous breathing and respiratory function stable ?Cardiovascular status: blood pressure returned to baseline and stable ?Postop Assessment: no headache and no apparent nausea or vomiting ?Anesthetic complications: no ?Comments: Late entry ? ? ?No notable events documented. ? ? ?Last Vitals:  ?Vitals:  ? 05/05/21 0748 05/05/21 0847  ?BP: 112/70 (!) 93/53  ?Pulse: 73 69  ?Resp: 18 16  ?Temp: 36.8 ?C 36.6 ?C  ?SpO2: 97% 95%  ?  ?Last Pain:  ?Vitals:  ? 05/05/21 0847  ?TempSrc: Oral  ?PainSc: 0-No pain  ? ? ?  ?  ?  ?  ?  ?  ? ?Louann Sjogren ? ? ? ? ?

## 2021-05-05 NOTE — H&P (Signed)
Primary Care Physician:  Gildardo Pounds, NP ?Primary Gastroenterologist:  Dr. Abbey Chatters ? ?Pre-Procedure History & Physical: ?HPI:  Gina Odonnell is a 41 y.o. female is here  for a colonoscopy to be performed for high risk colon cancer screening purposes, family history of colon cancer in father.. ? ?Past Medical History:  ?Diagnosis Date  ? Pre-diabetes   ? ? ?Past Surgical History:  ?Procedure Laterality Date  ? ABDOMINAL HYSTERECTOMY    ? BILATERAL SALPINGECTOMY Bilateral 01/11/2018  ? Procedure: BILATERAL SALPINGECTOMY;  Surgeon: Florian Buff, MD;  Location: [facility];  Service: Gynecology;  Laterality: Bilateral;  ? carpal tunnel Right 2022  ? CHOLECYSTECTOMY    ? SUPRACERVICAL ABDOMINAL HYSTERECTOMY N/A 01/11/2018  ? Procedure: HYSTERECTOMY SUPRACERVICAL ABDOMINAL;  Surgeon: Florian Buff, MD;  Location: [facility];  Service: Gynecology;  Laterality: N/A;  ? ? ?Prior to Admission medications   ?Not on File  ? ? ?Allergies as of 04/21/2021  ? (No Known Allergies)  ? ? ?Family History  ?Problem Relation Age of Onset  ? Cancer Other   ? Colon cancer Father   ? Hypertension Mother   ? Diabetes Mother   ? ? ?Social History  ? ?Socioeconomic History  ? Marital status: Divorced  ?  Spouse name: Not on file  ? Number of children: 1  ? Years of education: Not on file  ? Highest education level: Not on file  ?Occupational History  ? Not on file  ?Tobacco Use  ? Smoking status: Never  ? Smokeless tobacco: Never  ?Vaping Use  ? Vaping Use: Never used  ?Substance and Sexual Activity  ? Alcohol use: No  ? Drug use: No  ? Sexual activity: Yes  ?  Birth control/protection: Surgical  ?  Comment: SCH.tubal  ?Other Topics Concern  ? Not on file  ?Social History Narrative  ? Not on file  ? ?Social Determinants of Health  ? ?Financial Resource Strain: Medium Risk  ? Difficulty of Paying Living Expenses: Somewhat hard  ?Food Insecurity: Food Insecurity Present  ? Worried About Charity fundraiser in the Last Year: Sometimes true  ?  Ran Out of Food in the Last Year: Often true  ?Transportation Needs: No Transportation Needs  ? Lack of Transportation (Medical): No  ? Lack of Transportation (Non-Medical): No  ?Physical Activity: Sufficiently Active  ? Days of Exercise per Week: 4 days  ? Minutes of Exercise per Session: 100 min  ?Stress: No Stress Concern Present  ? Feeling of Stress : Only a little  ?Social Connections: Moderately Integrated  ? Frequency of Communication with Friends and Family: More than three times a week  ? Frequency of Social Gatherings with Friends and Family: Once a week  ? Attends Religious Services: More than 4 times per year  ? Active Member of Clubs or Organizations: Yes  ? Attends Archivist Meetings: More than 4 times per year  ? Marital Status: Separated  ?Intimate Partner Violence: Not At Risk  ? Fear of Current or Ex-Partner: No  ? Emotionally Abused: No  ? Physically Abused: No  ? Sexually Abused: No  ? ? ?Review of Systems: ?See HPI, otherwise negative ROS ? ?Physical Exam: ?Vital signs in last 24 hours: ?Temp:  [98.3 ?F (36.8 ?C)] 98.3 ?F (36.8 ?C) (03/28 0932) ?Pulse Rate:  [73] 73 (03/28 0748) ?Resp:  [18] 18 (03/28 0748) ?BP: (112)/(70) 112/70 (03/28 0748) ?SpO2:  [97 %] 97 % (03/28 0748) ?Weight:  [74.8 kg] 74.8  kg (03/28 0748) ?  ?General:   Alert,  Well-developed, well-nourished, pleasant and cooperative in NAD ?Head:  Normocephalic and atraumatic. ?Eyes:  Sclera clear, no icterus.   Conjunctiva pink. ?Ears:  Normal auditory acuity. ?Nose:  No deformity, discharge,  or lesions. ?Mouth:  No deformity or lesions, dentition normal. ?Neck:  Supple; no masses or thyromegaly. ?Lungs:  Clear throughout to auscultation.   No wheezes, crackles, or rhonchi. No acute distress. ?Heart:  Regular rate and rhythm; no murmurs, clicks, rubs,  or gallops. ?Abdomen:  Soft, nontender and nondistended. No masses, hepatosplenomegaly or hernias noted. Normal bowel sounds, without guarding, and without rebound.   ?Msk:   Symmetrical without gross deformities. Normal posture. ?Extremities:  Without clubbing or edema. ?Neurologic:  Alert and  oriented x4;  grossly normal neurologically. ?Skin:  Intact without significant lesions or rashes. ?Cervical Nodes:  No significant cervical adenopathy. ?Psych:  Alert and cooperative. Normal mood and affect. ? ?Impression/Plan: ?Gina Odonnell is here for a colonoscopy to be performed for high risk colon cancer screening purposes, family history of colon cancer in father.. ? ?The risks of the procedure including infection, bleed, or perforation as well as benefits, limitations, alternatives and imponderables have been reviewed with the patient. Questions have been answered. All parties agreeable. ? ? ?

## 2021-05-08 ENCOUNTER — Encounter (HOSPITAL_COMMUNITY): Payer: Self-pay | Admitting: Internal Medicine

## 2022-02-03 ENCOUNTER — Ambulatory Visit (INDEPENDENT_AMBULATORY_CARE_PROVIDER_SITE_OTHER): Payer: Medicaid Other | Admitting: Radiology

## 2022-02-03 ENCOUNTER — Encounter: Payer: Self-pay | Admitting: Radiology

## 2022-02-03 VITALS — BP 116/78 | Ht 63.0 in | Wt 187.0 lb

## 2022-02-03 DIAGNOSIS — N898 Other specified noninflammatory disorders of vagina: Secondary | ICD-10-CM | POA: Diagnosis not present

## 2022-02-03 DIAGNOSIS — Z01419 Encounter for gynecological examination (general) (routine) without abnormal findings: Secondary | ICD-10-CM

## 2022-02-03 DIAGNOSIS — N760 Acute vaginitis: Secondary | ICD-10-CM

## 2022-02-03 LAB — WET PREP FOR TRICH, YEAST, CLUE

## 2022-02-03 MED ORDER — FLUCONAZOLE 150 MG PO TABS: 150.0000 mg | ORAL_TABLET | ORAL | 0 refills | Status: DC

## 2022-02-03 NOTE — Progress Notes (Signed)
   Gina Odonnell April 18, 1980 [id number]   History:  41 y.o. G1P1 presents for annual exam as a new patient. S/p supracervical hyst in 2019 for fibroids. Since her surgery she has struggled with vaginal odor and d/c along with mild stress incontinence  Gynecologic History Hysterectomy: 2019  Sexually active: yes  Health Maintenance Last Pap: 2022. Results were: normal Last mammogram: 02/24/21. Results were: normal Last colonoscopy: 05/05/21. Results were: normal    Past medical history, past surgical history, family history and social history were all reviewed and documented in the EPIC chart.  ROS:  A ROS was performed and pertinent positives and negatives are included.  Exam:  Vitals:   02/03/22 0858  BP: 116/78  Weight: 187 lb (84.8 kg)  Height: '5\' 3"'$  (1.6 m)   Body mass index is 33.13 kg/m.  General appearance:  Normal Thyroid:  Symmetrical, normal in size, without palpable masses or nodularity. Respiratory  Auscultation:  Clear without wheezing or rhonchi Cardiovascular  Auscultation:  Regular rate, without rubs, murmurs or gallops  Edema/varicosities:  Not grossly evident Abdominal  Soft,nontender, without masses, guarding or rebound.  Liver/spleen:  No organomegaly noted  Hernia:  None appreciated  Skin  Inspection:  Grossly normal Breasts: Examined lying and sitting.   Right: Without masses, retractions, nipple discharge or axillary adenopathy.   Left: Without masses, retractions, nipple discharge or axillary adenopathy. Genitourinary   Inguinal/mons:  Normal without inguinal adenopathy  External genitalia:  Normal appearing vulva with no masses, tenderness, or lesions  BUS/Urethra/Skene's glands:  Normal  Vagina:  Normal appearing with normal color. Moderate amount of white thin discharge, no lesions. Atrophy mild  Cervix:  absent  Uterus:  absent  Adnexa/parametria:     Rt: Normal in size, without masses or tenderness.   Lt: Normal in size, without  masses or tenderness.  Anus and perineum: Normal  Digital rectal exam: Normal sphincter tone without palpated masses or tenderness  Patient informed chaperone available to be present for breast and pelvic exam. Patient has requested no chaperone to be present. Patient has been advised what will be completed during breast and pelvic exam.  Microscopic wet-mount exam shows hyphae.   Assessment/Plan:   1. Well woman exam with routine gynecological exam Pap due 2027  Mammogram due 02/2022  2. Vaginal odor +yeast on wet mount  Diflucan '150mg'$  po once,repeat 3 days Kegels for stress incontinence, if no improvement will send to PFPT  Discussed SBE, pap colonoscopy and DEXA screening as appropriate. Encouraged 14mns/week of cardiovascular and weight bearing exercise minimum. Recommend the use of seatbelts and sunscreen consistently.   Return in 1 year for annual or sooner prn.  CRubbie BattiestB WHNP-BC 9:18 AM 02/03/2022

## 2023-03-17 ENCOUNTER — Encounter (HOSPITAL_COMMUNITY): Payer: Self-pay | Admitting: Emergency Medicine

## 2023-03-17 ENCOUNTER — Emergency Department (HOSPITAL_COMMUNITY): Payer: 59

## 2023-03-17 ENCOUNTER — Emergency Department (HOSPITAL_COMMUNITY)
Admission: EM | Admit: 2023-03-17 | Discharge: 2023-03-17 | Disposition: A | Discharge status: Home / Self Care | Payer: 59 | Attending: Student | Admitting: Student

## 2023-03-17 ENCOUNTER — Other Ambulatory Visit: Payer: Self-pay

## 2023-03-17 DIAGNOSIS — J101 Influenza due to other identified influenza virus with other respiratory manifestations: Secondary | ICD-10-CM | POA: Insufficient documentation

## 2023-03-17 DIAGNOSIS — Z7984 Long term (current) use of oral hypoglycemic drugs: Secondary | ICD-10-CM | POA: Diagnosis not present

## 2023-03-17 DIAGNOSIS — Z20822 Contact with and (suspected) exposure to covid-19: Secondary | ICD-10-CM | POA: Diagnosis not present

## 2023-03-17 DIAGNOSIS — R509 Fever, unspecified: Secondary | ICD-10-CM | POA: Diagnosis present

## 2023-03-17 LAB — CBC WITH DIFFERENTIAL/PLATELET
Abs Immature Granulocytes: 0.02 10*3/uL (ref 0.00–0.07)
Basophils Absolute: 0 10*3/uL (ref 0.0–0.1)
Basophils Relative: 0 %
Eosinophils Absolute: 0 10*3/uL (ref 0.0–0.5)
Eosinophils Relative: 0 %
HCT: 38.5 % (ref 36.0–46.0)
Hemoglobin: 13.7 g/dL (ref 12.0–15.0)
Immature Granulocytes: 0 %
Lymphocytes Relative: 12 %
Lymphs Abs: 0.7 10*3/uL (ref 0.7–4.0)
MCH: 28.7 pg (ref 26.0–34.0)
MCHC: 35.6 g/dL (ref 30.0–36.0)
MCV: 80.5 fL (ref 80.0–100.0)
Monocytes Absolute: 1.1 10*3/uL — ABNORMAL HIGH (ref 0.1–1.0)
Monocytes Relative: 18 %
Neutro Abs: 4.1 10*3/uL (ref 1.7–7.7)
Neutrophils Relative %: 70 %
Platelets: 276 10*3/uL (ref 150–400)
RBC: 4.78 MIL/uL (ref 3.87–5.11)
RDW: 12.1 % (ref 11.5–15.5)
WBC: 5.9 10*3/uL (ref 4.0–10.5)
nRBC: 0 % (ref 0.0–0.2)

## 2023-03-17 LAB — I-STAT CHEM 8, ED
BUN: 4 mg/dL — ABNORMAL LOW (ref 6–20)
Calcium, Ion: 1.12 mmol/L — ABNORMAL LOW (ref 1.15–1.40)
Chloride: 99 mmol/L (ref 98–111)
Creatinine, Ser: 0.7 mg/dL (ref 0.44–1.00)
Glucose, Bld: 270 mg/dL — ABNORMAL HIGH (ref 70–99)
HCT: 40 % (ref 36.0–46.0)
Hemoglobin: 13.6 g/dL (ref 12.0–15.0)
Potassium: 3.8 mmol/L (ref 3.5–5.1)
Sodium: 133 mmol/L — ABNORMAL LOW (ref 135–145)
TCO2: 23 mmol/L (ref 22–32)

## 2023-03-17 LAB — COMPREHENSIVE METABOLIC PANEL
ALT: 49 U/L — ABNORMAL HIGH (ref 0–44)
AST: 40 U/L (ref 15–41)
Albumin: 3.7 g/dL (ref 3.5–5.0)
Alkaline Phosphatase: 65 U/L (ref 38–126)
Anion gap: 13 (ref 5–15)
BUN: 5 mg/dL — ABNORMAL LOW (ref 6–20)
CO2: 20 mmol/L — ABNORMAL LOW (ref 22–32)
Calcium: 9.1 mg/dL (ref 8.9–10.3)
Chloride: 95 mmol/L — ABNORMAL LOW (ref 98–111)
Creatinine, Ser: 0.76 mg/dL (ref 0.44–1.00)
GFR, Estimated: >60 mL/min (ref >60)
Glucose, Bld: 262 mg/dL — ABNORMAL HIGH (ref 70–99)
Potassium: 3.8 mmol/L (ref 3.5–5.1)
Sodium: 128 mmol/L — ABNORMAL LOW (ref 135–145)
Total Bilirubin: 0.6 mg/dL (ref 0.0–1.2)
Total Protein: 7.2 g/dL (ref 6.5–8.1)

## 2023-03-17 LAB — RESP PANEL BY RT-PCR (RSV, FLU A&B, COVID)  RVPGX2
Influenza A by PCR: POSITIVE — AB
Influenza B by PCR: NEGATIVE
Resp Syncytial Virus by PCR: NEGATIVE
SARS Coronavirus 2 by RT PCR: NEGATIVE

## 2023-03-17 LAB — PROTIME-INR
INR: 1.1 (ref 0.8–1.2)
Prothrombin Time: 14.6 s (ref 11.4–15.2)

## 2023-03-17 LAB — TROPONIN I (HIGH SENSITIVITY): Troponin I (High Sensitivity): 4 ng/L (ref <18)

## 2023-03-17 MED ORDER — IOHEXOL 350 MG/ML SOLN
75.0000 mL | Freq: Once | INTRAVENOUS | Status: AC | PRN
Start: 2023-03-17 — End: 2023-03-17
  Administered 2023-03-17: 75 mL via INTRAVENOUS

## 2023-03-17 MED ORDER — DIPHENHYDRAMINE HCL 50 MG/ML IJ SOLN
25.0000 mg | Freq: Once | INTRAMUSCULAR | Status: AC
  Administered 2023-03-17: 25 mg via INTRAVENOUS
  Filled 2023-03-17: qty 1

## 2023-03-17 MED ORDER — KETOROLAC TROMETHAMINE 15 MG/ML IJ SOLN
15.0000 mg | Freq: Once | INTRAMUSCULAR | Status: AC
  Administered 2023-03-17: 15 mg via INTRAVENOUS
  Filled 2023-03-17: qty 1

## 2023-03-17 MED ORDER — NAPROXEN 375 MG PO TABS: 375.0000 mg | ORAL_TABLET | Freq: Two times a day (BID) | ORAL | 0 refills | Status: AC

## 2023-03-17 MED ORDER — LACTATED RINGERS IV BOLUS
1000.0000 mL | Freq: Once | INTRAVENOUS | Status: AC
  Administered 2023-03-17: 1000 mL via INTRAVENOUS

## 2023-03-17 MED ORDER — OXYCODONE HCL 5 MG PO TABS
5.0000 mg | ORAL_TABLET | Freq: Once | ORAL | Status: AC
  Administered 2023-03-17: 5 mg via ORAL
  Filled 2023-03-17: qty 1

## 2023-03-17 MED ORDER — BENZONATATE 100 MG PO CAPS: 100.0000 mg | ORAL_CAPSULE | Freq: Three times a day (TID) | ORAL | 0 refills | Status: AC

## 2023-03-17 MED ORDER — ACETAMINOPHEN 500 MG PO TABS
1000.0000 mg | ORAL_TABLET | Freq: Once | ORAL | Status: AC
  Administered 2023-03-17: 1000 mg via ORAL
  Filled 2023-03-17: qty 2

## 2023-03-17 MED ORDER — PROCHLORPERAZINE EDISYLATE 10 MG/2ML IJ SOLN
10.0000 mg | Freq: Once | INTRAMUSCULAR | Status: AC
  Administered 2023-03-17: 10 mg via INTRAVENOUS
  Filled 2023-03-17: qty 2

## 2023-03-17 NOTE — ED Provider Notes (Signed)
 [place] [facility] AT [facility] Provider Note  CSN: [id number] Arrival date & time: 03/17/23 1507  Chief Complaint(s) Hyperglycemia  HPI Gina Odonnell is a 43 y.o. female who presents emergency room for evaluation of multiple complaints including fever, cough, headache, dizziness, chest pain and shortness of breath.  Patient with mild hyperglycemia at home as well.  Symptoms have been present for the last 24 hours.  Denies associated nausea, vomiting, urinary frequency or dysuria.   Past Medical History Past Medical History:  Diagnosis Date   Pre-diabetes    Patient Active Problem List   Diagnosis Date Noted   Routine general medical examination at a health care facility 01/28/2021   S/P abdominal supracervical subtotal hysterectomy 01/28/2021   Encounter for screening fecal occult blood testing 01/28/2021   Vaginal odor 01/28/2021   Screening for colorectal cancer 01/28/2021   Family history of colon cancer in father 01/28/2021   Screening mammogram for breast cancer 01/28/2021   Anxiety and depression 01/28/2021   Vaginal dryness 11/15/2018   S/P hysterectomy 01/11/2018   Encounter for gynecological examination with Papanicolaou smear of cervix 12/05/2017   Enlarged uterus 12/05/2017   Menorrhagia with regular cycle 12/05/2017   Home Medication(s) Prior to Admission medications   Medication Sig Start Date End Date Taking? Authorizing Provider  atorvastatin (LIPITOR) 20 MG tablet Take 1 tablet by mouth at bedtime. 02/16/23 05/17/23 Yes [provider]  benzonatate  (TESSALON ) 100 MG capsule Take 1 capsule (100 mg total) by mouth every 8 (eight) hours. 03/17/23  Yes Jarod Bozzo, MD  metFORMIN (GLUCOPHAGE) 1000 MG tablet Take 1,000 mg by mouth 2 (two) times daily with a meal. 02/16/23 05/17/23 Yes [provider]  naproxen  (NAPROSYN ) 375 MG tablet Take 1 tablet (375 mg total) by mouth 2 (two) times daily. 03/17/23  Yes Shakemia Madera, MD   Accu-Chek Softclix Lancets lancets 1 each by Other route 2 (two) times daily as needed (glucose check). 02/16/23   [provider]  Blood Glucose Monitoring Suppl (ACCU-CHEK GUIDE) w/Device KIT 1 kit by Other route. 02/18/23   [provider]                                                                                                                                    Past Surgical History Past Surgical History:  Procedure Laterality Date   ABDOMINAL HYSTERECTOMY     BILATERAL SALPINGECTOMY Bilateral 01/11/2018   Procedure: BILATERAL SALPINGECTOMY;  Surgeon: Jayne Vonn DEL, MD;  Location: [facility];  Service: Gynecology;  Laterality: Bilateral;   carpal tunnel Right 2022   CHOLECYSTECTOMY     COLONOSCOPY WITH PROPOFOL  N/A 05/05/2021   Procedure: COLONOSCOPY WITH PROPOFOL ;  Surgeon: Cindie Carlin POUR, DO;  Location: [facility];  Service: Endoscopy;  Laterality: N/A;  9:00 / ASA 2   SUPRACERVICAL ABDOMINAL HYSTERECTOMY N/A 01/11/2018   Procedure: HYSTERECTOMY SUPRACERVICAL ABDOMINAL;  Surgeon: Jayne Vonn DEL, MD;  Location: [facility];  Service: Gynecology;  Laterality: N/A;   Family History Family History  Problem Relation Age of Onset   Hypertension Mother    Diabetes Mother    Kidney disease Mother    Colon cancer Father    Cancer Other     Social History Social History   Tobacco Use   Smoking status: Never    Passive exposure: Never   Smokeless tobacco: Never  Vaping Use   Vaping status: Never Used  Substance Use Topics   Alcohol use: No   Drug use: No   Allergies Patient has no known allergies.  Review of Systems Review of Systems  Constitutional:  Positive for chills, fatigue and fever.  Respiratory:  Positive for cough and shortness of breath.   Cardiovascular:  Positive for chest pain.    Physical Exam Vital Signs  I have reviewed the triage vital signs BP 100/60   Pulse 88   Temp 99 F (37.2 C) (Oral)   Resp 17   Wt 74.8 kg   LMP  01/06/2018   SpO2 94%   BMI 29.23 kg/m   Physical Exam Vitals and nursing note reviewed.  Constitutional:      General: She is not in acute distress.    Appearance: She is well-developed.  HENT:     Head: Normocephalic and atraumatic.  Eyes:     Conjunctiva/sclera: Conjunctivae normal.  Cardiovascular:     Rate and Rhythm: Normal rate and regular rhythm.     Heart sounds: No murmur heard. Pulmonary:     Effort: Pulmonary effort is normal. No respiratory distress.     Breath sounds: Normal breath sounds.  Abdominal:     Palpations: Abdomen is soft.     Tenderness: There is no abdominal tenderness.  Musculoskeletal:        General: No swelling.     Cervical back: Neck supple.  Skin:    General: Skin is warm and dry.     Capillary Refill: Capillary refill takes less than 2 seconds.  Neurological:     Mental Status: She is alert.  Psychiatric:        Mood and Affect: Mood normal.     ED Results and Treatments Labs (all labs ordered are listed, but only abnormal results are displayed) Labs Reviewed  RESP PANEL BY RT-PCR (RSV, FLU A&B, COVID)  RVPGX2 - Abnormal; Notable for the following components:      Result Value   Influenza A by PCR POSITIVE (*)    All other components within normal limits  COMPREHENSIVE METABOLIC PANEL - Abnormal; Notable for the following components:   Sodium 128 (*)    Chloride 95 (*)    CO2 20 (*)    Glucose, Bld 262 (*)    BUN 5 (*)    ALT 49 (*)    All other components within normal limits  CBC WITH DIFFERENTIAL/PLATELET - Abnormal; Notable for the following components:   Monocytes Absolute 1.1 (*)    All other components within normal limits  I-STAT CHEM 8, ED - Abnormal; Notable for the following components:   Sodium 133 (*)    BUN 4 (*)    Glucose, Bld 270 (*)    Calcium, Ion 1.12 (*)    All other components within normal limits  CULTURE, BLOOD (ROUTINE X 2)  CULTURE, BLOOD (ROUTINE X 2)  PROTIME-INR  URINALYSIS, W/ REFLEX TO  CULTURE (INFECTION SUSPECTED)  I-STAT CG4 LACTIC ACID, ED  I-STAT CG4 LACTIC  ACID, ED  TROPONIN I (HIGH SENSITIVITY)                                                                                                                          Radiology CT ABDOMEN PELVIS W CONTRAST Result Date: 03/17/2023 CLINICAL DATA:  Acute nonlocalized abdominal pain. Fever, cough, headache, dizziness, chest pain, and shortness of breath. EXAM: CT ABDOMEN AND PELVIS WITH CONTRAST TECHNIQUE: Multidetector CT imaging of the abdomen and pelvis was performed using the standard protocol following bolus administration of intravenous contrast. RADIATION DOSE REDUCTION: This exam was performed according to the departmental dose-optimization program which includes automated exposure control, adjustment of the mA and/or kV according to patient size and/or use of iterative reconstruction technique. CONTRAST:  75mL OMNIPAQUE  IOHEXOL  350 MG/ML SOLN COMPARISON:  None Available. FINDINGS: Lower chest: Mild dependent atelectasis in the lung bases. Calcified granuloma in the right lung base. Hepatobiliary: Diffuse fatty infiltration of the liver. No focal lesions identified. Surgical absence of the gallbladder. No bile duct dilatation. Pancreas: Unremarkable. No pancreatic ductal dilatation or surrounding inflammatory changes. Spleen: Normal in size without focal abnormality. Adrenals/Urinary Tract: No significant adrenal gland nodules. Kidneys are symmetrical. Nephrograms are homogeneous. No hydronephrosis or hydroureter. Bladder is normal. Stomach/Bowel: Stomach, small bowel, and colon are not abnormally distended. No wall thickening or inflammatory changes. Appendix is normal. Vascular/Lymphatic: No significant vascular findings are present. No enlarged abdominal or pelvic lymph nodes. Reproductive: Uterus and bilateral adnexa are unremarkable. Other: No abdominal wall hernia or abnormality. No abdominopelvic ascites. Musculoskeletal: No  acute or significant osseous findings. IMPRESSION: 1. No acute process demonstrated in the abdomen or pelvis. No evidence of bowel obstruction or inflammation. 2. Fatty infiltration of the liver. Electronically Signed   By: Elsie Gravely M.D.   On: 03/17/2023 18:35   DG Chest 2 View Result Date: 03/17/2023 CLINICAL DATA:  Possible sepsis.  Hypoglycemia. EXAM: CHEST - 2 VIEW COMPARISON:  Chest radiograph dated 09/03/2019 FINDINGS: No focal consolidation, pleural effusion, or pneumothorax. The cardiac silhouette is within normal limits. No acute osseous pathology. IMPRESSION: No active cardiopulmonary disease. Electronically Signed   By: Vanetta Chou M.D.   On: 03/17/2023 16:18    Pertinent labs & imaging results that were available during my care of the patient were reviewed by me and considered in my medical decision making (see MDM for details).  Medications Ordered in ED Medications  acetaminophen  (TYLENOL ) tablet 1,000 mg (1,000 mg Oral Given 03/17/23 1638)  oxyCODONE  (Oxy IR/ROXICODONE ) immediate release tablet 5 mg (5 mg Oral Given 03/17/23 1638)  prochlorperazine  (COMPAZINE ) injection 10 mg (10 mg Intravenous Given 03/17/23 1733)  diphenhydrAMINE  (BENADRYL ) injection 25 mg (25 mg Intravenous Given 03/17/23 1733)  ketorolac  (TORADOL ) 15 MG/ML injection 15 mg (15 mg Intravenous Given 03/17/23 1733)  lactated ringers  bolus 1,000 mL (0 mLs Intravenous Stopped 03/17/23 1940)  iohexol  (OMNIPAQUE ) 350 MG/ML injection 75 mL (75 mLs Intravenous Contrast Given 03/17/23 1826)  Procedures Procedures  (including critical care time)  Medical Decision Making / ED Course   This patient presents to the ED for concern of cough, fever, chest pain, shortness of breath, abdominal pain, this involves an extensive number of treatment options, and is a complaint that carries with it a  high risk of complications and morbidity.  The differential diagnosis includes COVID-19, influenza, RSV, unspecified viral URI, viral gastroenteritis, intra-abdominal infection, ACS, bacteremia  MDM: Patient seen emergency room for evaluation of multiple complaints as described above.  Patient arrives febrile and tachycardic and sepsis labs ordered.  Laboratory evaluation reassuring with no segment leukocytosis, mild hyponatremia to 128, CO2 20, glucose 262.  Chest x-ray and CT abdomen pelvis are reassuringly unremarkable.  Patient is influenza positive which likely explains her constellation of symptoms.  Patient given a headache cocktail and antipyretics and on reevaluation symptoms have significant improved.  At this time she does not meet inpatient criteria for admission and will be discharged with outpatient follow-up.   Additional history obtained: -Additional history obtained from daughter -External records from outside source obtained and reviewed including: Chart review including previous notes, labs, imaging, consultation notes   Lab Tests: -I ordered, reviewed, and interpreted labs.   The pertinent results include:   Labs Reviewed  RESP PANEL BY RT-PCR (RSV, FLU A&B, COVID)  RVPGX2 - Abnormal; Notable for the following components:      Result Value   Influenza A by PCR POSITIVE (*)    All other components within normal limits  COMPREHENSIVE METABOLIC PANEL - Abnormal; Notable for the following components:   Sodium 128 (*)    Chloride 95 (*)    CO2 20 (*)    Glucose, Bld 262 (*)    BUN 5 (*)    ALT 49 (*)    All other components within normal limits  CBC WITH DIFFERENTIAL/PLATELET - Abnormal; Notable for the following components:   Monocytes Absolute 1.1 (*)    All other components within normal limits  I-STAT CHEM 8, ED - Abnormal; Notable for the following components:   Sodium 133 (*)    BUN 4 (*)    Glucose, Bld 270 (*)    Calcium, Ion 1.12 (*)    All other components  within normal limits  CULTURE, BLOOD (ROUTINE X 2)  CULTURE, BLOOD (ROUTINE X 2)  PROTIME-INR  URINALYSIS, W/ REFLEX TO CULTURE (INFECTION SUSPECTED)  I-STAT CG4 LACTIC ACID, ED  I-STAT CG4 LACTIC ACID, ED  TROPONIN I (HIGH SENSITIVITY)      EKG   EKG Interpretation Date/Time:  Thursday March 17 2023 15:57:51 EST Ventricular Rate:  114 PR Interval:  172 QRS Duration:  76 QT Interval:  318 QTC Calculation: 438 R Axis:   9  Text Interpretation: Sinus tachycardia Minimal voltage criteria for LVH, may be normal variant ( R in aVL ) When compared with ECG of 03-Sep-2019 17:36, PREVIOUS ECG IS PRESENT Confirmed by Haider Hornaday ([id number]) on 03/17/2023 11:50:01 PM         Imaging Studies ordered: I ordered imaging studies including chest x-ray, CT abdomen pelvis I independently visualized and interpreted imaging. I agree with the radiologist interpretation   Medicines ordered and prescription drug management: Meds ordered this encounter  Medications   acetaminophen  (TYLENOL ) tablet 1,000 mg   oxyCODONE  (Oxy IR/ROXICODONE ) immediate release tablet 5 mg    Refill:  0   prochlorperazine  (COMPAZINE ) injection 10 mg   diphenhydrAMINE  (BENADRYL ) injection 25 mg   ketorolac  (TORADOL ) 15 MG/ML  injection 15 mg   lactated ringers  bolus 1,000 mL   iohexol  (OMNIPAQUE ) 350 MG/ML injection 75 mL   naproxen  (NAPROSYN ) 375 MG tablet    Sig: Take 1 tablet (375 mg total) by mouth 2 (two) times daily.    Dispense:  20 tablet    Refill:  0   benzonatate  (TESSALON ) 100 MG capsule    Sig: Take 1 capsule (100 mg total) by mouth every 8 (eight) hours.    Dispense:  21 capsule    Refill:  0    -I have reviewed the patients home medicines and have made adjustments as needed  Critical interventions none    Cardiac Monitoring: The patient was maintained on a cardiac monitor.  I personally viewed and interpreted the cardiac monitored which showed an underlying rhythm of: NSR, sinus  tachycardia  Social Determinants of Health:  Factors impacting patients care include: none   Reevaluation: After the interventions noted above, I reevaluated the patient and found that they have :improved  Co morbidities that complicate the patient evaluation  Past Medical History:  Diagnosis Date   Pre-diabetes       Dispostion: I considered admission for this patient, but at this time she does not meet inpatient criteria for admission and will be discharged with outpatient follow-up     Final Clinical Impression(s) / ED Diagnoses Final diagnoses:  Influenza A     @PCDICTATION @    Ronetta Molla, Lum, MD 03/17/23 2350

## 2023-03-17 NOTE — ED Triage Notes (Signed)
 Pt presents from home for fever (103F) x 2 days, HA, dizziness, cough, lower abd pain, high blood sugar (220mg /dL).  Denies vaginal bleeding, urinary sx, blood in stool.  H/o DM II, palpitations (wearing zio monitor at this time to eval)

## 2023-03-17 NOTE — ED Provider Triage Note (Signed)
 Emergency Medicine Provider Triage Evaluation Note  Gina Odonnell , a 43 y.o. female  was evaluated in triage.  Pt complains of fever, cough, headache, dizziness, chest pain, shortness of breath, elevated blood sugar since yesterday.  Review of Systems  Positive:  Negative:   Physical Exam  BP (!) 137/93 (BP Location: Right Arm)   Pulse (!) 125   Temp (!) 103 F (39.4 C) (Oral)   Resp 18   Wt 74.8 kg   LMP 01/06/2018   SpO2 97%   BMI 29.23 kg/m  Gen:   Awake, no distress   Resp:  Normal effort, lung sounds clear MSK:   Moves extremities without difficulty  Other:  Neuro without gross deficit  Medical Decision Making  Medically screening exam initiated at 3:53 PM.  Appropriate orders placed.  Alean Kromer was informed that the remainder of the evaluation will be completed by another provider, this initial triage assessment does not replace that evaluation, and the importance of remaining in the ED until their evaluation is complete.     Donnajean Lynwood DEL, PA-C 03/17/23 1555

## 2023-03-17 NOTE — ED Notes (Signed)
 Per MD, may hold off on blood cultures, lactic acid until Resp panel results.

## 2023-03-22 LAB — CULTURE, BLOOD (ROUTINE X 2)
Culture: NO GROWTH
Culture: NO GROWTH

## 2023-08-08 IMAGING — MG MM DIGITAL DIAGNOSTIC UNILAT*L* W/ TOMO W/ CAD
4 series · 4 of 12 positions shown · non-contrast
Comparison: Baseline screening mammogram dated 02/05/2021.

CLINICAL DATA: Screening recall for possible left breast masses.

EXAM:
DIGITAL DIAGNOSTIC UNILATERAL LEFT MAMMOGRAM WITH TOMOSYNTHESIS AND
CAD; ULTRASOUND LEFT BREAST LIMITED
TECHNIQUE: Left digital diagnostic mammography and breast tomosynthesis was
performed. The images were evaluated with computer-aided detection.;
Targeted ultrasound examination of the left breast was performed.

[L CC synth-2D]
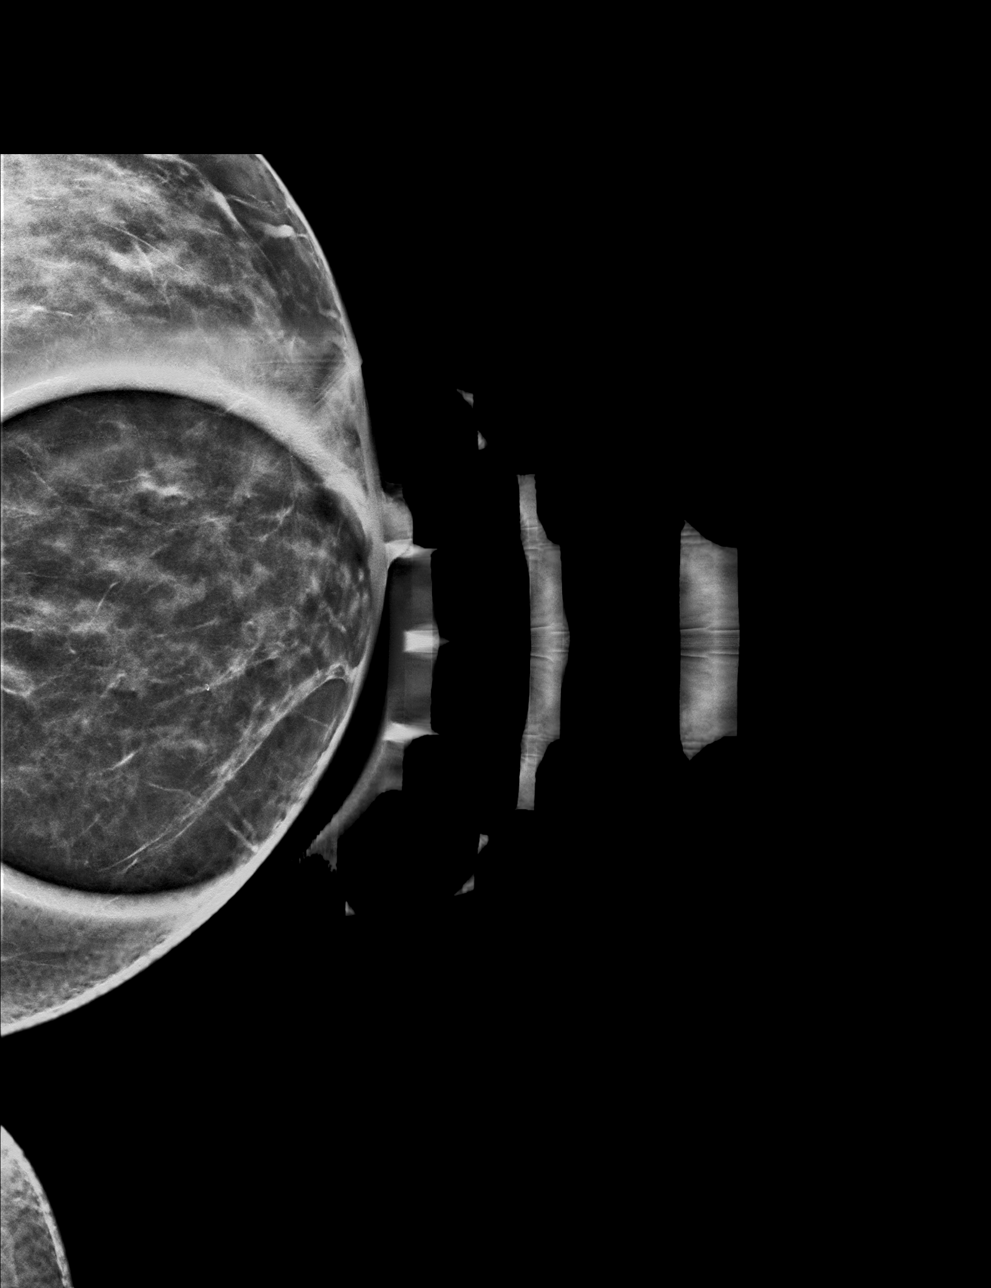

[L MLO synth-2D]
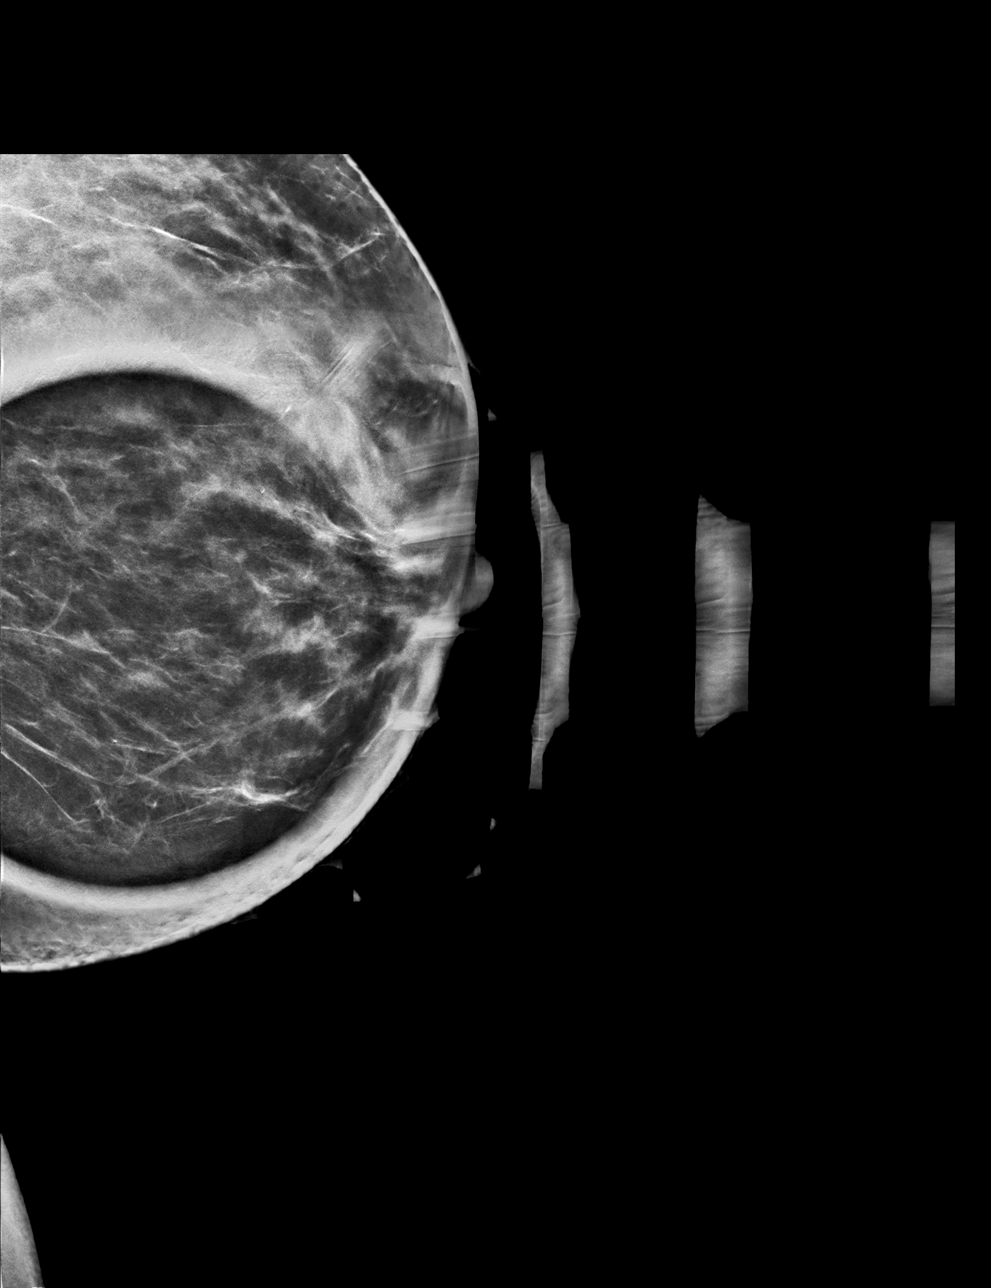

[L MLO tomo · tomo slice 35/69.0]
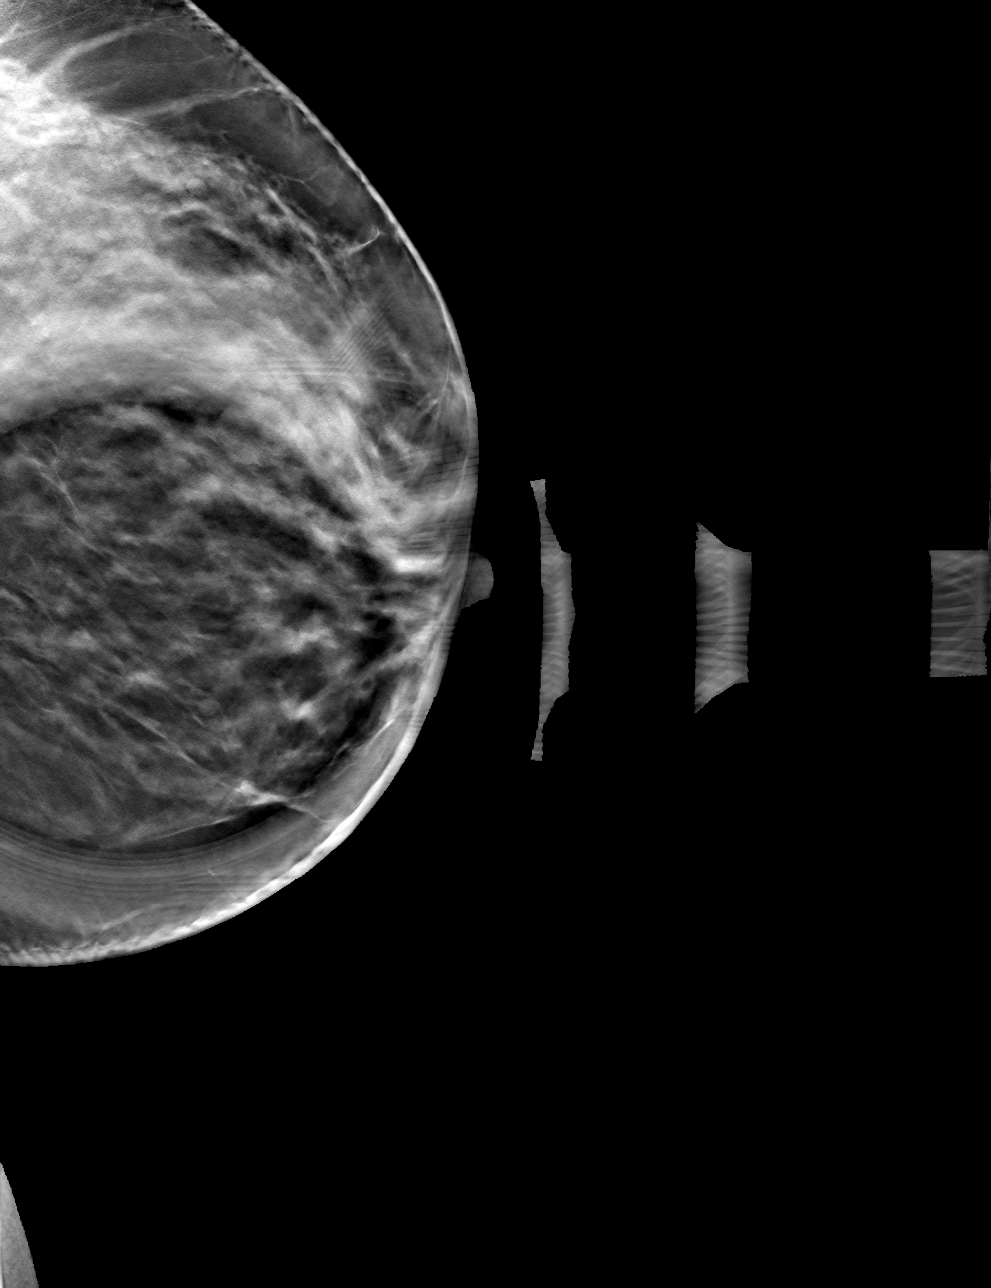

[L CC tomo · tomo slice 29/58.0]
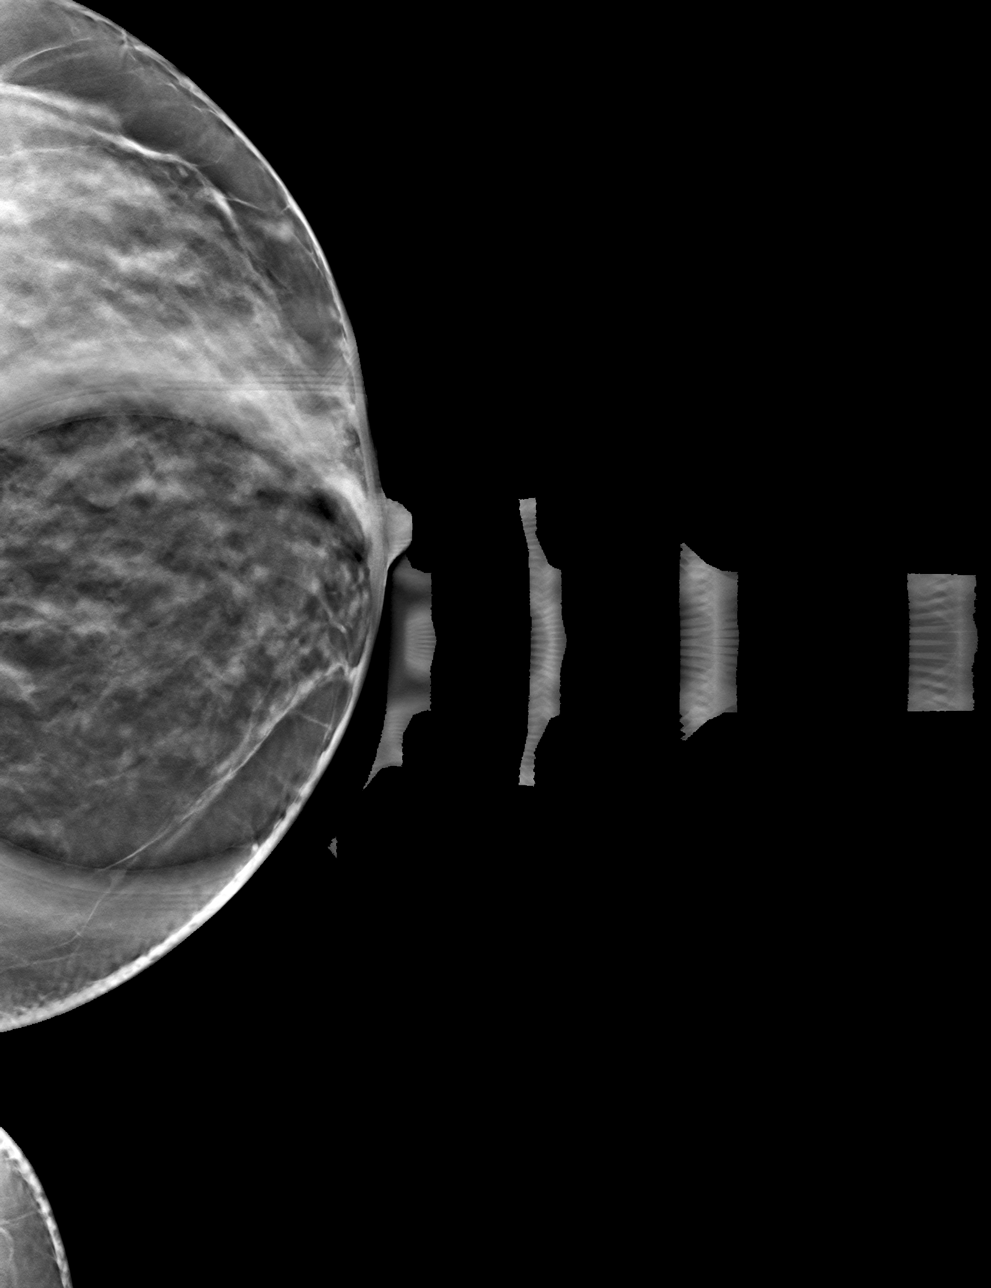

[4 of 12 positions shown; findings below may reference images not displayed]

ACR Breast Density Category c: The breast tissue is heterogeneously
dense, which may obscure small masses.
FINDINGS: Spot compression tomograms were performed over the upper inner left
breast. There are at least 2 oval masses with obscured margins in
the upper central to slightly inner left breast.

Targeted ultrasound of the upper and inner left breast was performed
demonstrating numerous cysts and clusters of cysts. A cyst in the
left breast at 10 o'clock 4 cm from nipple measures 1.2 x 0.6 x
cm. An additional cluster of cysts in the left breast 11 o'clock 4
cm from nipple measures 0.7 x 0.4 x 0.9 cm. A smaller cyst at 10
o'clock 4 cm from nipple measures 0.6 x 0.4 x 0.5 cm. These cysts
correspond well with the masses seen in the left breast at
mammography. No suspicious masses or any other abnormality seen in
the left breast sonographically.
IMPRESSION: No findings of malignancy in the left breast.

RECOMMENDATION:
Screening mammogram in one year.(Code:X5-L-1LP)

I have discussed the findings and recommendations with the patient.
If applicable, a reminder letter will be sent to the patient
regarding the next appointment.

BI-RADS CATEGORY  2: Benign.

## 2023-08-08 IMAGING — US US BREAST*L* LIMITED INC AXILLA
1 series · 13 of 22 positions shown · non-contrast
Comparison: Baseline screening mammogram dated 02/05/2021.

CLINICAL DATA: Screening recall for possible left breast masses.

EXAM:
DIGITAL DIAGNOSTIC UNILATERAL LEFT MAMMOGRAM WITH TOMOSYNTHESIS AND
CAD; ULTRASOUND LEFT BREAST LIMITED
TECHNIQUE: Left digital diagnostic mammography and breast tomosynthesis was
performed. The images were evaluated with computer-aided detection.;
Targeted ultrasound examination of the left breast was performed.

[Series 1: us breast*left* limited inc axilla · 0.08mm/px · 13 of 22 slices shown]
[im 1/22]
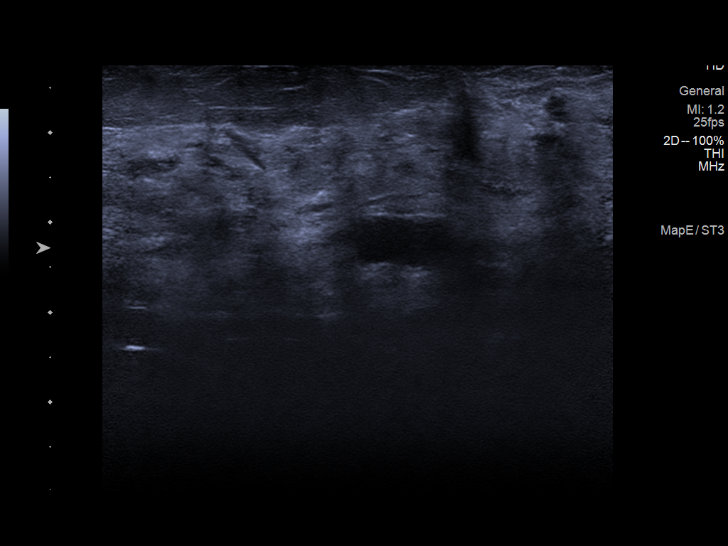
[im 3/22]
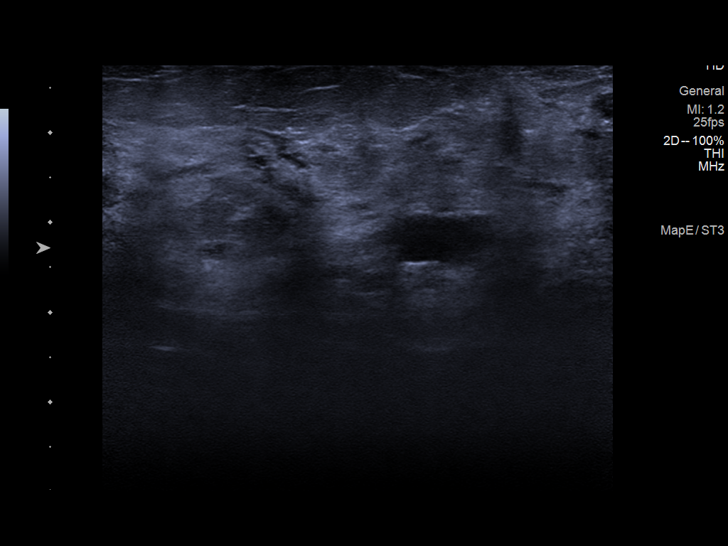
[im 5/22]
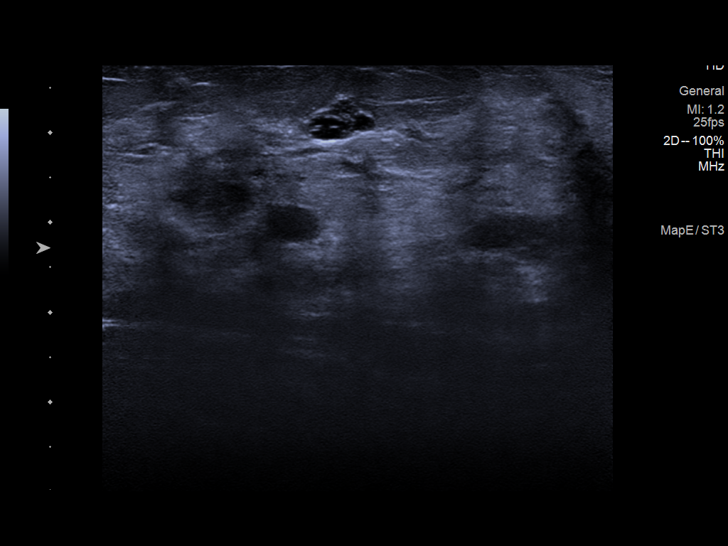
[im 6/22]
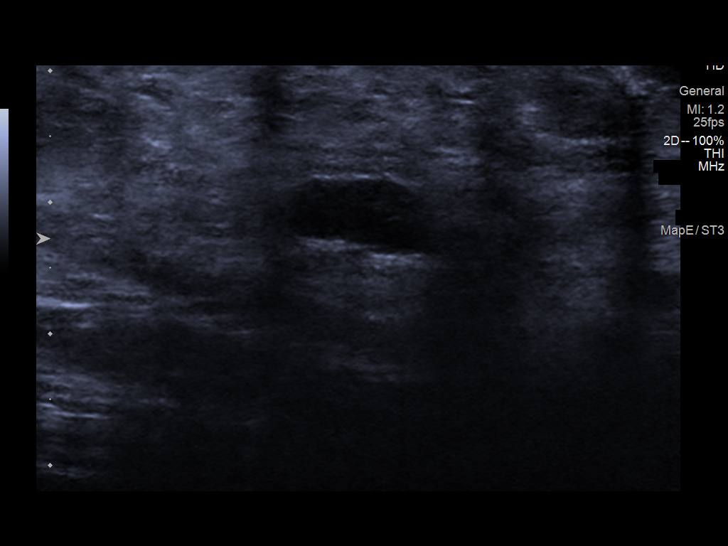
[im 8/22]
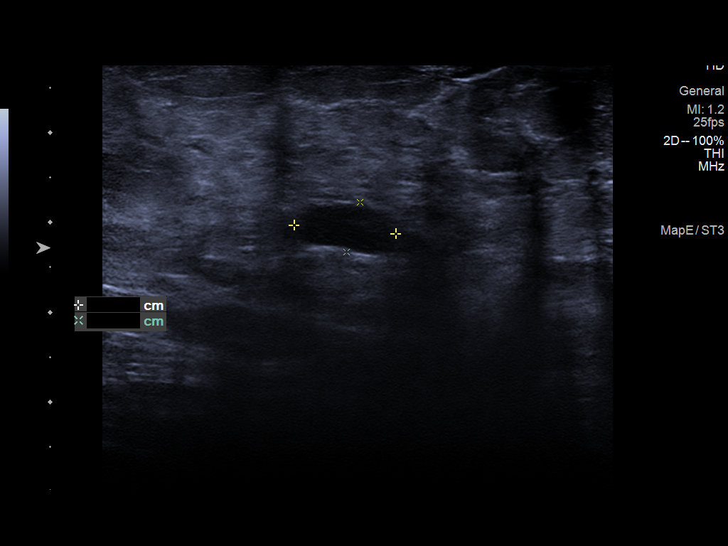
[im 10/22]
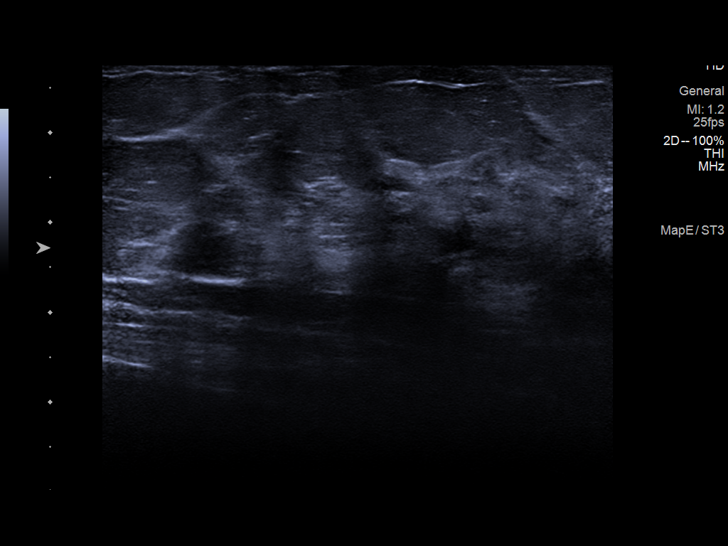
[im 12/22]
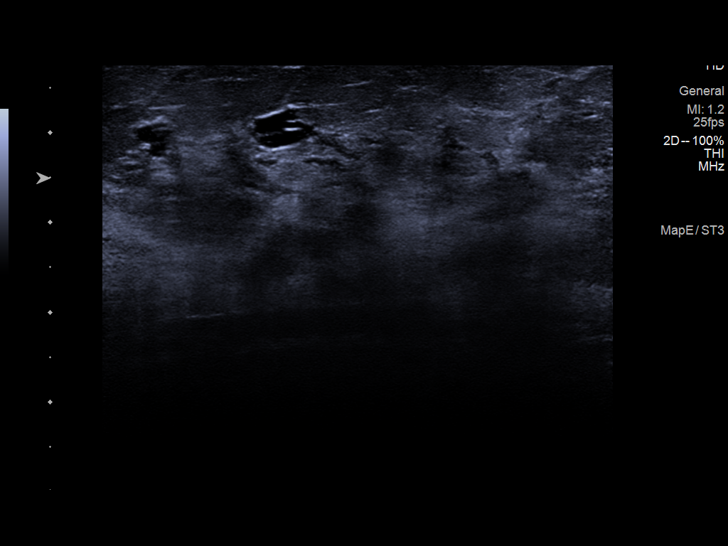
[im 13/22]
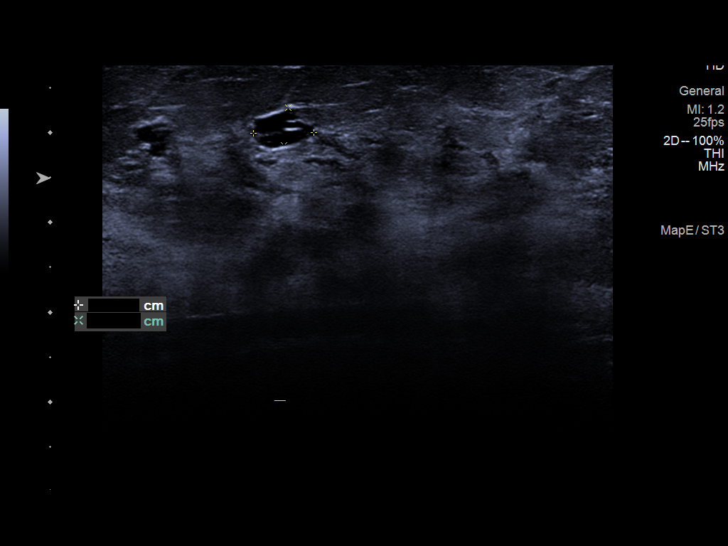
[im 15/22]
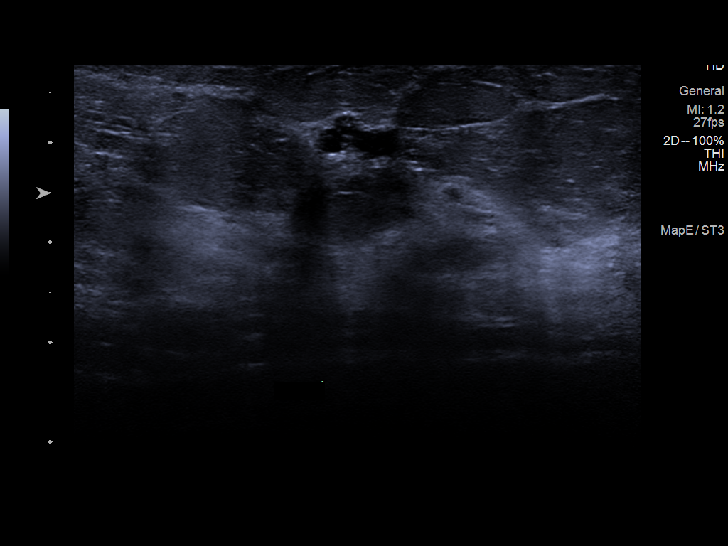
[im 17/22]
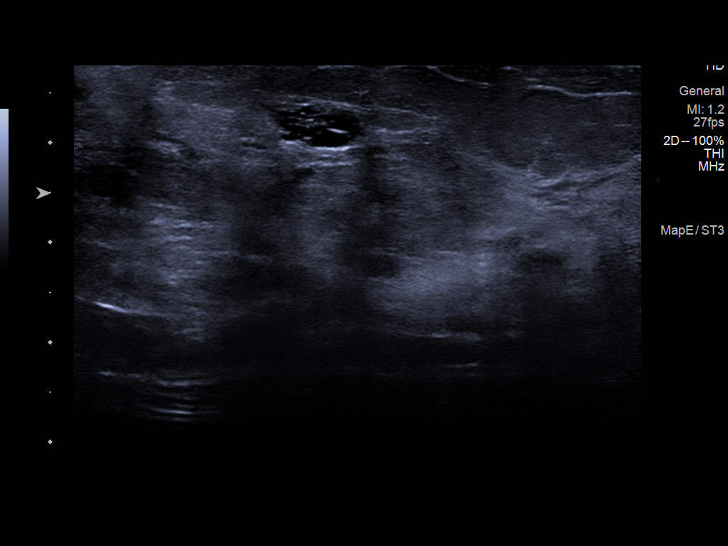
[im 18/22]
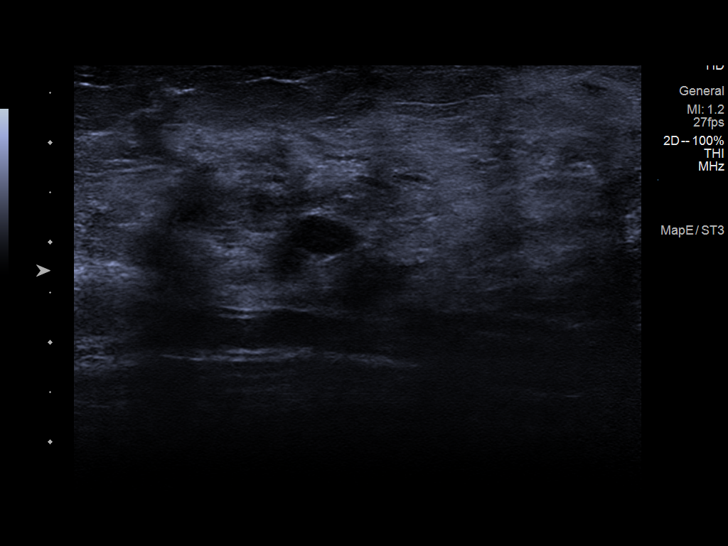
[im 20/22]
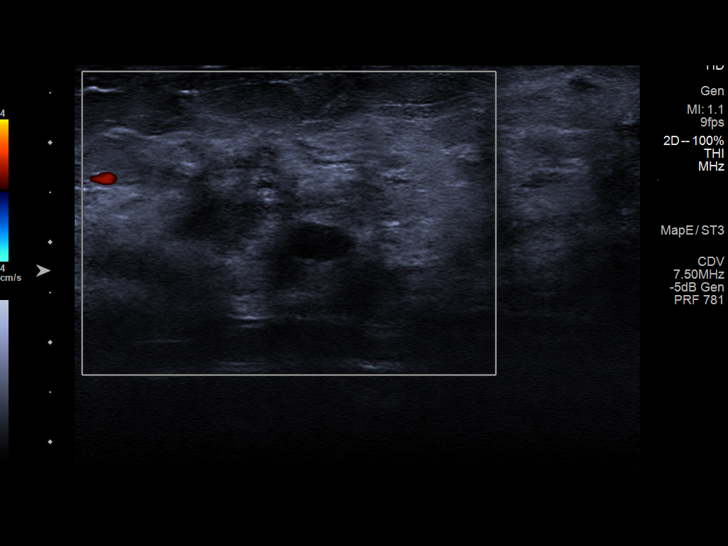
[im 22/22]
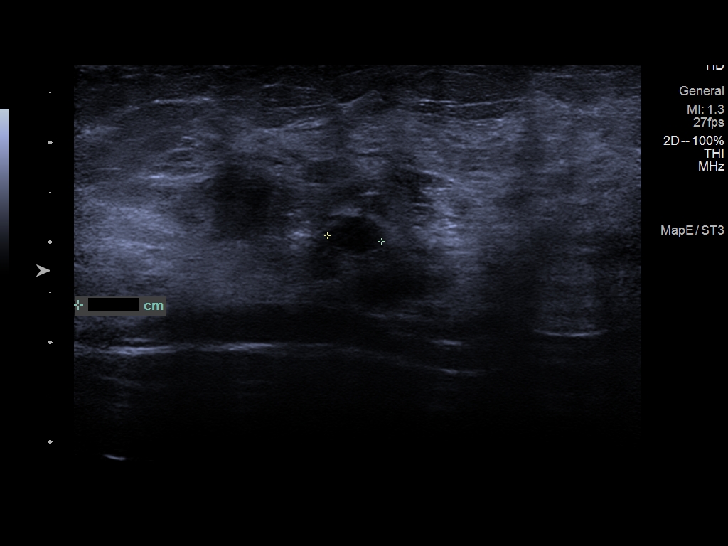

[13 of 22 positions shown; findings below may reference images not displayed]

ACR Breast Density Category c: The breast tissue is heterogeneously
dense, which may obscure small masses.
FINDINGS: Spot compression tomograms were performed over the upper inner left
breast. There are at least 2 oval masses with obscured margins in
the upper central to slightly inner left breast.

Targeted ultrasound of the upper and inner left breast was performed
demonstrating numerous cysts and clusters of cysts. A cyst in the
left breast at 10 o'clock 4 cm from nipple measures 1.2 x 0.6 x
cm. An additional cluster of cysts in the left breast 11 o'clock 4
cm from nipple measures 0.7 x 0.4 x 0.9 cm. A smaller cyst at 10
o'clock 4 cm from nipple measures 0.6 x 0.4 x 0.5 cm. These cysts
correspond well with the masses seen in the left breast at
mammography. No suspicious masses or any other abnormality seen in
the left breast sonographically.
IMPRESSION: No findings of malignancy in the left breast.

RECOMMENDATION:
Screening mammogram in one year.(Code:X5-L-1LP)

I have discussed the findings and recommendations with the patient.
If applicable, a reminder letter will be sent to the patient
regarding the next appointment.

BI-RADS CATEGORY  2: Benign.
